# Patient Record
Sex: Male | Born: 1955 | Race: White | Hispanic: No | Marital: Married | Smoking: Current every day smoker
Health system: Southern US, Community
[De-identification: ages and names within clinical notes are randomized; demographics above are authoritative.]

## PROBLEM LIST (undated history)

## (undated) DIAGNOSIS — I10 Essential (primary) hypertension: Secondary | ICD-10-CM

## (undated) DIAGNOSIS — R430 Anosmia: Secondary | ICD-10-CM

## (undated) DIAGNOSIS — T7840XA Allergy, unspecified, initial encounter: Secondary | ICD-10-CM

## (undated) DIAGNOSIS — M549 Dorsalgia, unspecified: Secondary | ICD-10-CM

## (undated) DIAGNOSIS — C801 Malignant (primary) neoplasm, unspecified: Secondary | ICD-10-CM

## (undated) DIAGNOSIS — R000 Tachycardia, unspecified: Secondary | ICD-10-CM

## (undated) DIAGNOSIS — J449 Chronic obstructive pulmonary disease, unspecified: Secondary | ICD-10-CM

## (undated) HISTORY — PX: HERNIA REPAIR: SHX51

## (undated) HISTORY — DX: Essential (primary) hypertension: I10

## (undated) HISTORY — DX: Dorsalgia, unspecified: M54.9

## (undated) HISTORY — DX: Allergy, unspecified, initial encounter: T78.40XA

## (undated) HISTORY — PX: BACK SURGERY: SHX140

## (undated) HISTORY — PX: VASECTOMY: SHX75

## (undated) HISTORY — DX: Chronic obstructive pulmonary disease, unspecified: J44.9

## (undated) HISTORY — DX: Anosmia: R43.0

---

## 2000-09-09 ENCOUNTER — Inpatient Hospital Stay (HOSPITAL_COMMUNITY): Admission: EM | Admit: 2000-09-09 | Discharge: 2000-09-11 | Payer: Self-pay | Admitting: Emergency Medicine

## 2000-09-09 ENCOUNTER — Encounter: Payer: Self-pay | Admitting: Emergency Medicine

## 2009-07-05 ENCOUNTER — Encounter: Payer: Self-pay | Admitting: Gastroenterology

## 2009-07-23 ENCOUNTER — Ambulatory Visit (HOSPITAL_COMMUNITY): Admission: RE | Admit: 2009-07-23 | Discharge: 2009-07-23 | Payer: Self-pay | Admitting: Gastroenterology

## 2009-07-23 ENCOUNTER — Ambulatory Visit: Payer: Self-pay | Admitting: Gastroenterology

## 2010-02-05 NOTE — Letter (Signed)
Summary: Internal Other/triage  Internal Other/triage   Imported By: Cloria Spring LPN 16/10/9602 54:09:81  _____________________________________________________________________  External Attachment:    Type:   Image     Comment:   External Document

## 2012-06-04 ENCOUNTER — Other Ambulatory Visit: Payer: Self-pay | Admitting: Family Medicine

## 2012-06-07 ENCOUNTER — Encounter: Payer: Self-pay | Admitting: *Deleted

## 2012-06-08 ENCOUNTER — Ambulatory Visit (INDEPENDENT_AMBULATORY_CARE_PROVIDER_SITE_OTHER): Payer: BC Managed Care – PPO | Admitting: Family Medicine

## 2012-06-08 ENCOUNTER — Encounter: Payer: Self-pay | Admitting: Family Medicine

## 2012-06-08 VITALS — BP 144/86 | HR 80 | Wt 178.0 lb

## 2012-06-08 DIAGNOSIS — J309 Allergic rhinitis, unspecified: Secondary | ICD-10-CM

## 2012-06-08 DIAGNOSIS — I10 Essential (primary) hypertension: Secondary | ICD-10-CM | POA: Insufficient documentation

## 2012-06-08 MED ORDER — ENALAPRIL MALEATE 10 MG PO TABS: 10.0000 mg | ORAL_TABLET | Freq: Every day | ORAL | Status: DC

## 2012-06-08 NOTE — Progress Notes (Signed)
  Subjective:    Patient ID: Leonard Armstrong, male    DOB: 1955-03-02, 57 y.o.   MRN: [id number]  Hypertension This is a chronic problem. The current episode started more than 1 year ago. The problem is unchanged. The problem is controlled. There are no associated agents to hypertension. Past treatments include ACE inhibitors. The current treatment provides moderate improvement. Compliance problems include exercise.  There is no history of angina. There is no history of chronic renal disease.   Allergies not so ad this spring.   Takes b p med during day,  Review of Systems    no chest pain no abdominal pain no shortness of breath otherwise negative. Unfortunately still smoking. Objective:   Physical Exam  Alert no acute distress. HEENT slight nasal congestion. Lungs clear. Heart regular rate and rhythm. Blood pressure 132/80 on repeat      Assessment & Plan:  Impression 1 hypertension good control. #2 chronic rhinitis stable. Plan encouraged to stop smoking. Diet exercise discussed. Maintain same meds.

## 2013-03-18 ENCOUNTER — Other Ambulatory Visit: Payer: Self-pay | Admitting: *Deleted

## 2013-03-18 MED ORDER — SILDENAFIL CITRATE 50 MG PO TABS: 50.0000 mg | ORAL_TABLET | Freq: Every day | ORAL | Status: DC | PRN

## 2013-03-19 ENCOUNTER — Other Ambulatory Visit: Payer: Self-pay | Admitting: *Deleted

## 2013-03-19 ENCOUNTER — Encounter: Payer: Self-pay | Admitting: *Deleted

## 2013-03-28 ENCOUNTER — Other Ambulatory Visit: Payer: Self-pay | Admitting: *Deleted

## 2013-03-28 MED ORDER — ENALAPRIL MALEATE 10 MG PO TABS: 10.0000 mg | ORAL_TABLET | Freq: Every day | ORAL | Status: DC

## 2013-04-11 ENCOUNTER — Encounter: Payer: Self-pay | Admitting: Family Medicine

## 2013-04-11 ENCOUNTER — Ambulatory Visit (INDEPENDENT_AMBULATORY_CARE_PROVIDER_SITE_OTHER): Payer: BC Managed Care – PPO | Admitting: Family Medicine

## 2013-04-11 VITALS — BP 148/88 | Ht 71.5 in | Wt 182.2 lb

## 2013-04-11 DIAGNOSIS — Z79899 Other long term (current) drug therapy: Secondary | ICD-10-CM

## 2013-04-11 DIAGNOSIS — Z125 Encounter for screening for malignant neoplasm of prostate: Secondary | ICD-10-CM

## 2013-04-11 DIAGNOSIS — Z0000 Encounter for general adult medical examination without abnormal findings: Secondary | ICD-10-CM

## 2013-04-11 DIAGNOSIS — Z23 Encounter for immunization: Secondary | ICD-10-CM

## 2013-04-11 MED ORDER — ENALAPRIL MALEATE 10 MG PO TABS: 10.0000 mg | ORAL_TABLET | Freq: Every day | ORAL | Status: DC

## 2013-04-11 MED ORDER — SILDENAFIL CITRATE 100 MG PO TABS: 50.0000 mg | ORAL_TABLET | Freq: Every day | ORAL | Status: DC | PRN

## 2013-04-11 NOTE — Progress Notes (Signed)
   Subjective:    Patient ID: Leonard Armstrong, male    DOB: Jun 04, 1955, 58 y.o.   MRN: [id number]  HPI Patient arrives for a yearly physical.  Allergies acting up some, started flonase otcf on it Exercise mod not tolerable  Aches and pains are starting to slow pt dow No problems or concerns.  Colon not due for yrs, had colonocopy and given a ten yr pass  Patient unfortunately still smoking. Some shortness of breath with excessive exertion.  Claims compliance with blood pressure medicine. No obvious side effects. Watching salt intake.  Continues to work full-time no major difficulties with this.  Review of Systems  Constitutional: Negative for fever, activity change and appetite change.  HENT: Negative for congestion and rhinorrhea.   Eyes: Negative for discharge.  Respiratory: Negative for cough and wheezing.   Cardiovascular: Negative for chest pain.  Gastrointestinal: Negative for vomiting, abdominal pain and blood in stool.  Genitourinary: Negative for frequency and difficulty urinating.  Musculoskeletal: Negative for neck pain.  Skin: Negative for rash.  Allergic/Immunologic: Negative for environmental allergies and food allergies.  Neurological: Negative for weakness and headaches.  Psychiatric/Behavioral: Negative for agitation.  All other systems reviewed and are negative.       Objective:   Physical Exam  Vitals reviewed. Constitutional: He appears well-developed and well-nourished.  HENT:  Head: Normocephalic and atraumatic.  Right Ear: External ear normal.  Left Ear: External ear normal.  Nose: Nose normal.  Mouth/Throat: Oropharynx is clear and moist.  Eyes: EOM are normal. Pupils are equal, round, and reactive to light.  Neck: Normal range of motion. Neck supple. No thyromegaly present.  Cardiovascular: Normal rate, regular rhythm and normal heart sounds.   No murmur heard. Pulmonary/Chest: Effort normal and breath sounds normal. No respiratory distress.  He has no wheezes.  Abdominal: Soft. Bowel sounds are normal. He exhibits no distension and no mass. There is no tenderness.  Genitourinary: Penis normal.  Musculoskeletal: Normal range of motion. He exhibits no edema.  Lymphadenopathy:    He has no cervical adenopathy.  Neurological: He is alert. He exhibits normal muscle tone.  Skin: Skin is warm and dry. No erythema.  Psychiatric: He has a normal mood and affect. His behavior is normal. Judgment normal.    Prostate within normal limits      Assessment & Plan:  Impression 1 wellness exam #2 hypertension good control. #3 chronic smoker discussed #4 erectile dysfunction #5 up-to-date on colonoscopy plan Hemoccult cards. Pneumonia vaccine. Diet exercise discussed in encourage. Encouraged to stop smoking. Appropriate blood work. Further recommendations based on results. WSL

## 2013-12-25 ENCOUNTER — Emergency Department (HOSPITAL_COMMUNITY): Payer: BC Managed Care – PPO

## 2013-12-25 ENCOUNTER — Observation Stay (HOSPITAL_COMMUNITY)
Admission: EM | Admit: 2013-12-25 | Discharge: 2013-12-26 | Disposition: A | Discharge status: Home / Self Care | Payer: BC Managed Care – PPO | Attending: Internal Medicine | Admitting: Internal Medicine

## 2013-12-25 ENCOUNTER — Encounter (HOSPITAL_COMMUNITY): Payer: Self-pay | Admitting: Emergency Medicine

## 2013-12-25 DIAGNOSIS — F17200 Nicotine dependence, unspecified, uncomplicated: Secondary | ICD-10-CM | POA: Diagnosis present

## 2013-12-25 DIAGNOSIS — I10 Essential (primary) hypertension: Secondary | ICD-10-CM | POA: Diagnosis present

## 2013-12-25 DIAGNOSIS — Z7951 Long term (current) use of inhaled steroids: Secondary | ICD-10-CM | POA: Diagnosis not present

## 2013-12-25 DIAGNOSIS — F1010 Alcohol abuse, uncomplicated: Secondary | ICD-10-CM | POA: Diagnosis present

## 2013-12-25 DIAGNOSIS — R000 Tachycardia, unspecified: Secondary | ICD-10-CM | POA: Diagnosis present

## 2013-12-25 DIAGNOSIS — F17210 Nicotine dependence, cigarettes, uncomplicated: Secondary | ICD-10-CM

## 2013-12-25 DIAGNOSIS — Z79899 Other long term (current) drug therapy: Secondary | ICD-10-CM | POA: Diagnosis not present

## 2013-12-25 DIAGNOSIS — Z720 Tobacco use: Secondary | ICD-10-CM | POA: Diagnosis not present

## 2013-12-25 DIAGNOSIS — I471 Supraventricular tachycardia: Secondary | ICD-10-CM | POA: Diagnosis not present

## 2013-12-25 HISTORY — DX: Tachycardia, unspecified: R00.0

## 2013-12-25 LAB — URINALYSIS, ROUTINE W REFLEX MICROSCOPIC
BILIRUBIN URINE: NEGATIVE
GLUCOSE, UA: NEGATIVE mg/dL
HGB URINE DIPSTICK: NEGATIVE
KETONES UR: NEGATIVE mg/dL
Leukocytes, UA: NEGATIVE
Nitrite: NEGATIVE
PH: 6 (ref 5.0–8.0)
Protein, ur: NEGATIVE mg/dL
Specific Gravity, Urine: 1.01 (ref 1.005–1.030)
Urobilinogen, UA: 0.2 mg/dL (ref 0.0–1.0)

## 2013-12-25 LAB — CBC WITH DIFFERENTIAL/PLATELET
Basophils Absolute: 0 10*3/uL (ref 0.0–0.1)
Basophils Relative: 0 % (ref 0–1)
EOS ABS: 0.1 10*3/uL (ref 0.0–0.7)
Eosinophils Relative: 1 % (ref 0–5)
HCT: 43.8 % (ref 39.0–52.0)
HEMOGLOBIN: 14.9 g/dL (ref 13.0–17.0)
LYMPHS ABS: 2.4 10*3/uL (ref 0.7–4.0)
Lymphocytes Relative: 26 % (ref 12–46)
MCH: 33.9 pg (ref 26.0–34.0)
MCHC: 34 g/dL (ref 30.0–36.0)
MCV: 99.8 fL (ref 78.0–100.0)
MONO ABS: 0.7 10*3/uL (ref 0.1–1.0)
MONOS PCT: 8 % (ref 3–12)
Neutro Abs: 5.8 10*3/uL (ref 1.7–7.7)
Neutrophils Relative %: 65 % (ref 43–77)
Platelets: 208 10*3/uL (ref 150–400)
RBC: 4.39 MIL/uL (ref 4.22–5.81)
RDW: 13.2 % (ref 11.5–15.5)
WBC: 8.9 10*3/uL (ref 4.0–10.5)

## 2013-12-25 LAB — BASIC METABOLIC PANEL
Anion gap: 13 (ref 5–15)
BUN: 15 mg/dL (ref 6–23)
CALCIUM: 9.2 mg/dL (ref 8.4–10.5)
CHLORIDE: 100 meq/L (ref 96–112)
CO2: 24 mEq/L (ref 19–32)
CREATININE: 0.83 mg/dL (ref 0.50–1.35)
GFR calc Af Amer: >90 mL/min (ref >90)
GFR calc non Af Amer: >90 mL/min (ref >90)
Glucose, Bld: 95 mg/dL (ref 70–99)
Potassium: 3.8 mEq/L (ref 3.7–5.3)
Sodium: 137 mEq/L (ref 137–147)

## 2013-12-25 LAB — TROPONIN I
Troponin I: <0.3 ng/mL (ref <0.30)
Troponin I: <0.3 ng/mL (ref <0.30)

## 2013-12-25 LAB — MAGNESIUM: Magnesium: 2.1 mg/dL (ref 1.5–2.5)

## 2013-12-25 MED ORDER — SODIUM CHLORIDE 0.9 % IJ SOLN
3.0000 mL | Freq: Two times a day (BID) | INTRAMUSCULAR | Status: DC
  Administered 2013-12-25 – 2013-12-26 (×2): 3 mL via INTRAVENOUS

## 2013-12-25 MED ORDER — METOPROLOL TARTRATE 1 MG/ML IV SOLN
5.0000 mg | Freq: Once | INTRAVENOUS | Status: AC
  Administered 2013-12-25: 5 mg via INTRAVENOUS
  Filled 2013-12-25: qty 5

## 2013-12-25 MED ORDER — ACETAMINOPHEN 325 MG PO TABS: 650.0000 mg | ORAL_TABLET | Freq: Four times a day (QID) | ORAL | Status: DC | PRN

## 2013-12-25 MED ORDER — LABETALOL HCL 5 MG/ML IV SOLN: 5.0000 mg | Freq: Four times a day (QID) | INTRAVENOUS | Status: DC | PRN

## 2013-12-25 MED ORDER — ACETAMINOPHEN 650 MG RE SUPP: 650.0000 mg | Freq: Four times a day (QID) | RECTAL | Status: DC | PRN

## 2013-12-25 MED ORDER — POLYETHYLENE GLYCOL 3350 17 G PO PACK
17.0000 g | PACK | Freq: Every day | ORAL | Status: DC
  Administered 2013-12-25 – 2013-12-26 (×2): 17 g via ORAL
  Filled 2013-12-25 (×2): qty 1

## 2013-12-25 MED ORDER — HEPARIN SODIUM (PORCINE) 5000 UNIT/ML IJ SOLN
5000.0000 [IU] | Freq: Three times a day (TID) | INTRAMUSCULAR | Status: DC
  Administered 2013-12-25 – 2013-12-26 (×2): 5000 [IU] via SUBCUTANEOUS
  Filled 2013-12-25 (×3): qty 1

## 2013-12-25 MED ORDER — SODIUM CHLORIDE 0.9 % IJ SOLN: 3.0000 mL | INTRAMUSCULAR | Status: DC | PRN

## 2013-12-25 MED ORDER — SODIUM CHLORIDE 0.9 % IJ SOLN
3.0000 mL | Freq: Two times a day (BID) | INTRAMUSCULAR | Status: DC
  Administered 2013-12-25: 3 mL via INTRAVENOUS

## 2013-12-25 MED ORDER — ADENOSINE 6 MG/2ML IV SOLN
INTRAVENOUS | Status: AC
Start: 2013-12-25 — End: 2013-12-26
  Filled 2013-12-25: qty 10

## 2013-12-25 MED ORDER — SODIUM CHLORIDE 0.9 % IV SOLN: 250.0000 mL | INTRAVENOUS | Status: DC | PRN

## 2013-12-25 MED ORDER — NICOTINE 21 MG/24HR TD PT24
21.0000 mg | MEDICATED_PATCH | Freq: Every day | TRANSDERMAL | Status: DC
  Administered 2013-12-26: 21 mg via TRANSDERMAL
  Filled 2013-12-25: qty 1

## 2013-12-25 NOTE — ED Notes (Addendum)
metoprolol given per order, holding adenoisine for now.  Second IV placed, urine specimen obtained.

## 2013-12-25 NOTE — ED Provider Notes (Signed)
CSN: [id number]     Arrival date & time 12/25/13  1956 History   First MD Initiated Contact with Patient 12/25/13 1959     Chief Complaint  Patient presents with  . Tachycardia      HPI Pt was seen at 2000. Per EMS and pt report, c/o sudden onset and persistence of multiple episodes of palpitations that began earlier today. Pt states he has hx of same and can usually improve with "coughing" and "bearing down." Pt states the palpitations returned despite performing these maneuvers. Pt endorses hx of "SVT" and was previously on "a beta blocker" but "got better so they took me off of it." EMS states pt was in NSR, rate 70-80's on their arrival to scene, but then had several episodes of SVT to 180's. Pt was able to decrease his HR en route by performing vagal maneuvers. On arrival to the ED, pt's HR again 180's. Pt denies any other complaints. Denies CP/SOB, no abd pain, no back pain, no N/V/D, no syncope.     Past Medical History  Diagnosis Date  . Back pain   . Hypertension   . Loss of smell   . Allergy   . Tachycardia    Past Surgical History  Procedure Laterality Date  . Back surgery    . Vasectomy    . Hernia repair     Family History  Problem Relation Age of Onset  . Lung cancer Mother   . Cancer Mother     lung  . Hypertension Father   . Cancer Father     father  . Heart disease Other   . Cancer Other     colon   History  Substance Use Topics  . Smoking status: Current Every Day Smoker  . Smokeless tobacco: Not on file  . Alcohol Use: No    Review of Systems ROS: Statement: All systems negative except as marked or noted in the HPI; Constitutional: Negative for fever and chills. ; ; Eyes: Negative for eye pain, redness and discharge. ; ; ENMT: Negative for ear pain, hoarseness, nasal congestion, sinus pressure and sore throat. ; ; Cardiovascular: +palpitations. Negative for chest pain, diaphoresis, dyspnea and peripheral edema. ; ; Respiratory: Negative for cough,  wheezing and stridor. ; ; Gastrointestinal: Negative for nausea, vomiting, diarrhea, abdominal pain, blood in stool, hematemesis, jaundice and rectal bleeding. . ; ; Genitourinary: Negative for dysuria, flank pain and hematuria. ; ; Musculoskeletal: Negative for back pain and neck pain. Negative for swelling and trauma.; ; Skin: Negative for pruritus, rash, abrasions, blisters, bruising and skin lesion.; ; Neuro: Negative for headache, lightheadedness and neck stiffness. Negative for weakness, altered level of consciousness , altered mental status, extremity weakness, paresthesias, involuntary movement, seizure and syncope.      Allergies  Other; Prednisone; and Wasp venom  Home Medications   Prior to Admission medications   Medication Sig Start Date End Date Taking? Authorizing Provider  enalapril (VASOTEC) 10 MG tablet Take 1 tablet (10 mg total) by mouth daily. 04/11/13   Mikey Kirschner, MD  fluticasone (FLONASE) 50 MCG/ACT nasal spray Place 2 sprays into the nose daily.    Historical Provider, MD  sildenafil (VIAGRA) 100 MG tablet Take 0.5-1 tablets (50-100 mg total) by mouth daily as needed for erectile dysfunction. 04/11/13   Mikey Kirschner, MD   BP 112/76 mmHg  Pulse 87  Temp(Src) 98.3 F (36.8 C) (Oral)  Resp 19  Ht 5\' 11"  (1.803 m)  Wt 175  lb (79.379 kg)  BMI 24.42 kg/m2  SpO2 98% Physical Exam  2005: Physical examination:  Nursing notes reviewed; Vital signs and O2 SAT reviewed;  Constitutional: Well developed, Well nourished, Well hydrated, In no acute distress; Head:  Normocephalic, atraumatic; Eyes: EOMI, PERRL, No scleral icterus; ENMT: Mouth and pharynx normal, Mucous membranes moist; Neck: Supple, Full range of motion, No lymphadenopathy; Cardiovascular: Tachycardic rate and regular rhythm, No gallop; Respiratory: Breath sounds clear & equal bilaterally, No wheezes.  Speaking full sentences with ease, Normal respiratory effort/excursion; Chest: Nontender, Movement normal;  Abdomen: Soft, Nontender, Nondistended, Normal bowel sounds; Genitourinary: No CVA tenderness; Extremities: Pulses normal, No tenderness, No edema, No calf edema or asymmetry.; Neuro: AA&Ox3, Major CN grossly intact.  Speech clear. No gross focal motor or sensory deficits in extremities.; Skin: Color normal, Warm, Dry.   ED Course  Procedures     EKG Interpretation   Date/Time:  Sunday December 25 2013 19:58:00 EST  On arrival Ventricular Rate:  177 PR Interval:    QRS Duration: 77 QT Interval:  274 QTC Calculation: 470 R Axis:   -18 Text Interpretation:  Supraventricular tachycardia Borderline left axis  deviation ST depression, probably rate related No old tracing to compare  Confirmed by [facility]  MD, Donika Butner [phone]) on 12/25/2013 8:59:27 PM      EKG Interpretation  Date/Time:  Sunday December 25 2013 21:00:59 EST  Repeat Ventricular Rate:  85 PR Interval:  220 QRS Duration: 83 QT Interval:  389 QTC Calculation: 463 R Axis:   -18 Text Interpretation:  Sinus rhythm Sinus pause Prolonged PR interval Inferior infarct, age indeterminate Probable anteroseptal infarct, old Normal sinus rhythm has replaced Supraventricular tachycardia Since last tracing of earlier today Confirmed by [facility]  MD, Nunzio Cory [phone]) on 12/25/2013 9:10:07 PM         MDM  MDM Reviewed: previous chart, nursing note and vitals Reviewed previous: labs Interpretation: ECG, labs and x-ray Total time providing critical care: 30-74 minutes. This excludes time spent performing separately reportable procedures and services. Consults: admitting MD    CRITICAL CARE Performed by: Alfonzo Feller Total critical care time: 35 Critical care time was exclusive of separately billable procedures and treating other patients. Critical care was necessary to treat or prevent imminent or life-threatening deterioration. Critical care was time spent personally by me on the following activities: development of  treatment plan with patient and/or surrogate as well as nursing, discussions with consultants, evaluation of patient's response to treatment, examination of patient, obtaining history from patient or surrogate, ordering and performing treatments and interventions, ordering and review of laboratory studies, ordering and review of radiographic studies, pulse oximetry and re-evaluation of patient's condition.    Results for orders placed or performed during the hospital encounter of 34/19/62  Basic metabolic panel  Result Value Ref Range   Sodium 137 137 - 147 mEq/L   Potassium 3.8 3.7 - 5.3 mEq/L   Chloride 100 96 - 112 mEq/L   CO2 24 19 - 32 mEq/L   Glucose, Bld 95 70 - 99 mg/dL   BUN 15 6 - 23 mg/dL   Creatinine, Ser 0.83 0.50 - 1.35 mg/dL   Calcium 9.2 8.4 - 10.5 mg/dL   GFR calc non Af Amer >90 >90 mL/min   GFR calc Af Amer >90 >90 mL/min   Anion gap 13 5 - 15  Troponin I  Result Value Ref Range   Troponin I <0.30 <0.30 ng/mL  CBC with Differential  Result Value Ref Range  WBC 8.9 4.0 - 10.5 K/uL   RBC 4.39 4.22 - 5.81 MIL/uL   Hemoglobin 14.9 13.0 - 17.0 g/dL   HCT 43.8 39.0 - 52.0 %   MCV 99.8 78.0 - 100.0 fL   MCH 33.9 26.0 - 34.0 pg   MCHC 34.0 30.0 - 36.0 g/dL   RDW 13.2 11.5 - 15.5 %   Platelets 208 150 - 400 K/uL   Neutrophils Relative % 65 43 - 77 %   Neutro Abs 5.8 1.7 - 7.7 K/uL   Lymphocytes Relative 26 12 - 46 %   Lymphs Abs 2.4 0.7 - 4.0 K/uL   Monocytes Relative 8 3 - 12 %   Monocytes Absolute 0.7 0.1 - 1.0 K/uL   Eosinophils Relative 1 0 - 5 %   Eosinophils Absolute 0.1 0.0 - 0.7 K/uL   Basophils Relative 0 0 - 1 %   Basophils Absolute 0.0 0.0 - 0.1 K/uL  Magnesium  Result Value Ref Range   Magnesium 2.1 1.5 - 2.5 mg/dL  Urinalysis, Routine w reflex microscopic  Result Value Ref Range   Color, Urine YELLOW YELLOW   APPearance CLEAR CLEAR   Specific Gravity, Urine 1.010 1.005 - 1.030   pH 6.0 5.0 - 8.0   Glucose, UA NEGATIVE NEGATIVE mg/dL   Hgb  urine dipstick NEGATIVE NEGATIVE   Bilirubin Urine NEGATIVE NEGATIVE   Ketones, ur NEGATIVE NEGATIVE mg/dL   Protein, ur NEGATIVE NEGATIVE mg/dL   Urobilinogen, UA 0.2 0.0 - 1.0 mg/dL   Nitrite NEGATIVE NEGATIVE   Leukocytes, UA NEGATIVE NEGATIVE   Dg Chest Port 1 View 12/25/2013   CLINICAL DATA:  Supraventricular tachycardia with response to coughing. History of hypertension and smoking. Initial encounter.  EXAM: PORTABLE CHEST - 1 VIEW  COMPARISON:  None.  FINDINGS: The heart size and mediastinal contours are normal. The lungs are clear. There is no pleural effusion or pneumothorax. No acute osseous findings are identified.  IMPRESSION: No active cardiopulmonary process.   Electronically Signed   By: Camie Patience M.D.   On: 12/25/2013 20:22    2105:  On arrival to the ED, pt's HR 180's, monitor with narrow complex, regular tachycardia (SVT). Pt would spontaneously drop his rate to 80's/NSR, then shortly afterwards increase again to SVT 170-180's. Prepared several times to given IV adenosine, but pt spontaneously converted on his own before dose was given. IV metoprolol given with good effect. Pt's monitor now with NSR, rate 80's, with intermittent sinus pauses. No afib, no further SVT. BP stable. Continues to deny CP/SOB. Dx and testing d/w pt and family.  Questions answered.  Verb understanding, agreeable to observation admit. T/C to [facility]. Wendee Beavers, case discussed, including:  HPI, pertinent PM/SHx, VS/PE, dx testing, ED course and treatment:  Agreeable to admit, requests to write temporary orders, obtain observation tele bed to team APAdmits.    Francine Graven, DO 12/26/13 1530

## 2013-12-25 NOTE — ED Notes (Signed)
Heart rate down to 70's SR with some irregularity, P wave and QRS complex present.

## 2013-12-25 NOTE — H&P (Signed)
[facility] History and Physical  MICKIE KOZIKOWSKI ZOX:[id number] DOB: 1955-03-25 DOA: 12/25/2013  Referring physician: Dr. Thurnell Garbe PCP: Rubbie Battiest, MD   Chief Complaint: "Felt my heart racing"  HPI: Leonard Armstrong is a 58 y.o. male  With history of ongoing tobacco use, history of tachycardia, and hypertension. The patient reports that he was just not feeling too well today. He felt like he had some pressure and felt as if he had some gas. Was unable to relieve himself. When he got up to use the restroom he noticed his heart started to race. He states he has felt heart palpitations on and off for years. 20 years ago was given a beta blocker for heart fast heart rate for 6 months. He has not seen a cardiologist in 15 years. Reportedly when EMS arrived they noticed abnormal EKG and family reports that patient had SVT which resolved after coughing. The patient denies any sick contacts. Denies any hematemesis or productive cough.  We were consulted for further medical evaluation and recommendations.   Review of Systems:  Constitutional:  No weight loss, night sweats, Fevers, chills, fatigue.  HEENT:  No headaches, Difficulty swallowing,Tooth/dental problems,Sore throat,  No sneezing, itching, ear ache, nasal congestion, post nasal drip,  Cardio-vascular:  No chest pain, Orthopnea, PND, swelling in lower extremities, anasarca, dizziness, + palpitations (not currently) GI:  No heartburn, indigestion, abdominal pain, nausea, vomiting, diarrhea, change in bowel habits, loss of appetite  Resp:  No shortness of breath with exertion or at rest. No excess mucus, no productive cough, No non-productive cough, No coughing up of blood.No change in color of mucus.No wheezing.No chest wall deformity  Skin:  no rash or lesions.  GU:  no dysuria, change in color of urine, no urgency or frequency. No flank pain.  Musculoskeletal:  No joint pain or swelling. No decreased range of motion. No back  pain.  Psych:  No change in mood or affect. No depression or anxiety. No memory loss.   Past Medical History  Diagnosis Date  . Back pain   . Hypertension   . Loss of smell   . Allergy   . Tachycardia    Past Surgical History  Procedure Laterality Date  . Back surgery    . Vasectomy    . Hernia repair     Social History:  reports that he has been smoking.  He does not have any smokeless tobacco history on file. He reports that he does not drink alcohol. His drug history is not on file.  Allergies  Allergen Reactions  . Other Other (See Comments)    Steroids- not in right state of mind. Makes vital signs erratic.   Marland Kitchen Prednisone Other (See Comments)    Not in right state of mind, makes vital signs erratic.   Marland Kitchen Wasp Venom Swelling    Family History  Problem Relation Age of Onset  . Lung cancer Mother   . Cancer Mother     lung  . Hypertension Father   . Cancer Father     father  . Heart disease Other   . Cancer Other     colon     Prior to Admission medications   Medication Sig Start Date End Date Taking? Authorizing Provider  enalapril (VASOTEC) 10 MG tablet Take 1 tablet (10 mg total) by mouth daily. 04/11/13   Mikey Kirschner, MD  fluticasone (FLONASE) 50 MCG/ACT nasal spray Place 2 sprays into the nose daily.    Historical  Provider, MD  sildenafil (VIAGRA) 100 MG tablet Take 0.5-1 tablets (50-100 mg total) by mouth daily as needed for erectile dysfunction. 04/11/13   Mikey Kirschner, MD   Physical Exam: Filed Vitals:   12/25/13 2000 12/25/13 2007 12/25/13 2015 12/25/13 2102  BP: 111/83 111/83  112/76  Pulse: 101 98 98 87  Temp:  98.3 F (36.8 C)    TempSrc:  Oral    Resp: 13 20  19   Height:  5\' 11"  (1.803 m)    Weight:  79.379 kg (175 lb)    SpO2: 98% 100%  98%    Wt Readings from Last 3 Encounters:  12/25/13 79.379 kg (175 lb)  04/11/13 82.645 kg (182 lb 3.2 oz)  06/08/12 80.74 kg (178 lb)    General:  Appears calm and comfortable Eyes: PERRL,  normal lids, irises & conjunctiva ENT: grossly normal hearing, lips & tongue Neck: no LAD, masses or thyromegaly Cardiovascular: RRR, no m/r/g. No LE edema. Telemetry: SR, no arrhythmias  Respiratory: CTA bilaterally, no w/r/r. Normal respiratory effort. Abdomen: soft, nt, nd Skin: no rash or induration seen on limited exam Musculoskeletal: grossly normal tone BUE/BLE Psychiatric: grossly normal mood and affect, speech fluent and appropriate Neurologic: Answers questions appropriately no facial asymmetry           Labs on Admission:  Basic Metabolic Panel:  Recent Labs Lab 12/25/13 2010  NA 137  K 3.8  CL 100  CO2 24  GLUCOSE 95  BUN 15  CREATININE 0.83  CALCIUM 9.2  MG 2.1   Liver Function Tests: No results for input(s): AST, ALT, ALKPHOS, BILITOT, PROT, ALBUMIN in the last 168 hours. No results for input(s): LIPASE, AMYLASE in the last 168 hours. No results for input(s): AMMONIA in the last 168 hours. CBC:  Recent Labs Lab 12/25/13 2010  WBC 8.9  NEUTROABS 5.8  HGB 14.9  HCT 43.8  MCV 99.8  PLT 208   Cardiac Enzymes:  Recent Labs Lab 12/25/13 2010  TROPONINI <0.30    BNP (last 3 results) No results for input(s): PROBNP in the last 8760 hours. CBG: No results for input(s): GLUCAP in the last 168 hours.  Radiological Exams on Admission: Dg Chest Port 1 View  12/25/2013   CLINICAL DATA:  Supraventricular tachycardia with response to coughing. History of hypertension and smoking. Initial encounter.  EXAM: PORTABLE CHEST - 1 VIEW  COMPARISON:  None.  FINDINGS: The heart size and mediastinal contours are normal. The lungs are clear. There is no pleural effusion or pneumothorax. No acute osseous findings are identified.  IMPRESSION: No active cardiopulmonary process.   Electronically Signed   By: Camie Patience M.D.   On: 12/25/2013 20:22    EKG: Independently reviewed. Sinus rhythm with no ST elevations or depressions  Assessment/Plan Principle  problem:  Tachycardia - Resolved. Reportedly patient was given B blocker in ED. Has had sinus pauses  - Will monitor on telemetry - Consult cardiology to see if patient warrants further work up from their standpoint. NPO after midnight. - Obtain echocardiogram  Active Problems:   HTN (hypertension) - B blocker PRN on board.    Nicotine dependence - Recommended cessation - nicotine patch   Code Status:  DVT Prophylaxis: Family Communication: Disposition Plan:  Time spent: > 45 minutes  Velvet Bathe [facility] Pager [phone]

## 2013-12-25 NOTE — ED Notes (Signed)
Family at bedside. Repeat EKG done. Patient states that he is not having any pain at this time. Vitals are within normal limits.

## 2013-12-25 NOTE — ED Notes (Signed)
Pt has had episodes of SVT. Up to 174. When patient coughs and vagals down rate goes down for several seconds but then rate goes back up

## 2013-12-25 NOTE — ED Notes (Addendum)
EKG done and given to EDP. Patient in hospital gown and placed on cardiac monitor.

## 2013-12-26 ENCOUNTER — Encounter (HOSPITAL_COMMUNITY): Payer: Self-pay | Admitting: Adult Health

## 2013-12-26 DIAGNOSIS — F1010 Alcohol abuse, uncomplicated: Secondary | ICD-10-CM | POA: Diagnosis present

## 2013-12-26 DIAGNOSIS — I471 Supraventricular tachycardia: Secondary | ICD-10-CM

## 2013-12-26 LAB — TROPONIN I: Troponin I: <0.3 ng/mL (ref <0.30)

## 2013-12-26 LAB — CBC
HCT: 43.2 % (ref 39.0–52.0)
Hemoglobin: 14.5 g/dL (ref 13.0–17.0)
MCH: 33.7 pg (ref 26.0–34.0)
MCHC: 33.6 g/dL (ref 30.0–36.0)
MCV: 100.5 fL — AB (ref 78.0–100.0)
Platelets: 182 10*3/uL (ref 150–400)
RBC: 4.3 MIL/uL (ref 4.22–5.81)
RDW: 13 % (ref 11.5–15.5)
WBC: 5.4 10*3/uL (ref 4.0–10.5)

## 2013-12-26 LAB — PHOSPHORUS: PHOSPHORUS: 3.8 mg/dL (ref 2.3–4.6)

## 2013-12-26 LAB — BASIC METABOLIC PANEL
Anion gap: 13 (ref 5–15)
BUN: 11 mg/dL (ref 6–23)
CALCIUM: 8.6 mg/dL (ref 8.4–10.5)
CO2: 22 mEq/L (ref 19–32)
Chloride: 105 mEq/L (ref 96–112)
Creatinine, Ser: 0.72 mg/dL (ref 0.50–1.35)
GFR calc Af Amer: >90 mL/min (ref >90)
GFR calc non Af Amer: >90 mL/min (ref >90)
GLUCOSE: 92 mg/dL (ref 70–99)
Potassium: 4 mEq/L (ref 3.7–5.3)
Sodium: 140 mEq/L (ref 137–147)

## 2013-12-26 LAB — MAGNESIUM: MAGNESIUM: 2.3 mg/dL (ref 1.5–2.5)

## 2013-12-26 MED ORDER — METOPROLOL TARTRATE 12.5 MG HALF TABLET: 12.5000 mg | ORAL_TABLET | Freq: Two times a day (BID) | ORAL | Status: DC

## 2013-12-26 MED ORDER — METOPROLOL TARTRATE 25 MG PO TABS
12.5000 mg | ORAL_TABLET | Freq: Two times a day (BID) | ORAL | Status: DC
  Administered 2013-12-26: 12.5 mg via ORAL
  Filled 2013-12-26: qty 1

## 2013-12-26 NOTE — Progress Notes (Signed)
UR completed 

## 2013-12-26 NOTE — Progress Notes (Signed)
Patient ID: COLEN ELTZROTH, male   DOB: 1955-09-20, 58 y.o.   MRN: [id number]   Echo without significant abnormalities. TSH still pending, however can be followed up as outpatient. No further inpatient cardiac testing indicated, will have patient follow up with NP Lawerence in 3-4 weeks to f/u symptoms. Continue lopressor at discharge, would not resume enalapril at this time, follow bp on lopressor.     Zandra Abts MD

## 2013-12-26 NOTE — Progress Notes (Signed)
D.c instructions reviewed with patient and wife.  Verbalized understanding.  Pt dc'd to home with wife. Schonewitz, Eulis Canner 12/26/2013

## 2013-12-26 NOTE — Discharge Instructions (Signed)
Nicotine Addiction Nicotine can act as both a stimulant (excites/activates) and a sedative (calms/quiets). Immediately after exposure to nicotine, there is a "kick" caused in part by the drug's stimulation of the adrenal glands and resulting discharge of adrenaline (epinephrine). The rush of adrenaline stimulates the body and causes a sudden release of sugar. This means that smokers are always slightly hyperglycemic. Hyperglycemic means that the blood sugar is high, just like in diabetics. Nicotine also decreases the amount of insulin which helps control sugar levels in the body. There is an increase in blood pressure, breathing, and the rate of heart beats.  In addition, nicotine indirectly causes a release of dopamine in the brain that controls pleasure and motivation. A similar reaction is seen with other drugs of abuse, such as cocaine and heroin. This dopamine release is thought to cause the pleasurable sensations when smoking. In some different cases, nicotine can also create a calming effect, depending on sensitivity of the smoker's nervous system and the dose of nicotine taken. WHAT HAPPENS WHEN NICOTINE IS TAKEN FOR LONG PERIODS OF TIME?  Long-term use of nicotine results in addiction. It is difficult to stop.  Repeated use of nicotine creates tolerance. Higher doses of nicotine are needed to get the "kick." When nicotine use is stopped, withdrawal may last a month or more. Withdrawal may begin within a few hours after the last cigarette. Symptoms peak within the first few days and may lessen within a few weeks. For some people, however, symptoms may last for months or longer. Withdrawal symptoms include:   Irritability.  Craving.  Learning and attention deficits.  Sleep disturbances.  Increased appetite. Craving for tobacco may last for 6 months or longer. Many behaviors done while using nicotine can also play a part in the severity of withdrawal symptoms. For some people, the feel,  smell, and sight of a cigarette and the ritual of obtaining, handling, lighting, and smoking the cigarette are closely linked with the pleasure of smoking. When stopped, they also miss the related behaviors which make the withdrawal or craving worse. While nicotine gum and patches may lessen the drug aspects of withdrawal, cravings often persist. WHAT ARE THE MEDICAL CONSEQUENCES OF NICOTINE USE?  Nicotine addiction accounts for one-third of all cancers. The top cancer caused by tobacco is lung cancer. Lung cancer is the number one cancer killer of both men and women.  Smoking is also associated with cancers of the:  Mouth.  Pharynx.  Larynx.  Esophagus.  Stomach.  Pancreas.  Cervix.  Kidney.  Ureter.  Bladder.  Smoking also causes lung diseases such as lasting (chronic) bronchitis and emphysema.  It worsens asthma in adults and children.  Smoking increases the risk of heart disease, including:  Stroke.  Heart attack.  Vascular disease.  Aneurysm.  Passive or secondary smoke can also increase medical risks including:  Asthma in children.  Sudden Infant Death Syndrome (SIDS).  Additionally, dropped cigarettes are the leading cause of residential fire fatalities.  Nicotine poisoning has been reported from accidental ingestion of tobacco products by children and pets. Death usually results in a few minutes from respiratory failure (when a person stops breathing) caused by paralysis. TREATMENT   Medication. Nicotine replacement medicines such as nicotine gum and the patch are used to stop smoking. These medicines gradually lower the dosage of nicotine in the body. These medicines do not contain the carbon monoxide and other toxins found in tobacco smoke.  Hypnotherapy.  Relaxation therapy.  Nicotine Anonymous (a 12-step support   program). Find times and locations in your local yellow pages. Document Released: 08/29/2003 Document Revised: 03/17/2011 Document  Reviewed: 02/18/2013 Exit[employer] Patient Information 2015 [employer]. This information is not intended to replace advice given to you by your health care provider. Make sure you discuss any questions you have with your health care provider.  

## 2013-12-26 NOTE — Care Management Note (Signed)
    Page 1 of 1   12/26/2013     2:48:39 PM CARE MANAGEMENT NOTE 12/26/2013  Patient:  Leonard Armstrong, Leonard Armstrong   Account Number:  [id number]  Date Initiated:  12/26/2013  Documentation initiated by:  Theophilus Kinds  Subjective/Objective Assessment:   Pt admitted from home with CP. Pt lives with his wife and will return home at discharge. Pt is independent with ADL's.     Action/Plan:   No Cm needs noted. Anticipate discharge today.   Anticipated DC Date:  12/26/2013   Anticipated DC Plan:  [place]  CM consult      Choice offered to / List presented to:             Status of service:  Completed, signed off Medicare Important Message given?   (If response is "NO", the following Medicare IM given date fields will be blank) Date Medicare IM given:   Medicare IM given by:   Date Additional Medicare IM given:   Additional Medicare IM given by:    Discharge Disposition:  HOME/SELF CARE  Per UR Regulation:    If discussed at Long Length of Stay Meetings, dates discussed:    Comments:  12/26/13 Brice [place], RN BSN CM

## 2013-12-26 NOTE — Consult Note (Signed)
CARDIOLOGY CONSULT NOTE   Patient ID: Leonard Armstrong MRN: [id number] DOB/AGE: 56-Aug-1957 56 y.o.  Admit Date: 12/25/2013 Referring Physician: PTH  Primary Physician: Rubbie Battiest, MD Consulting Cardiologist: Carlyle Dolly MD Primary Cardiologist: New Reason for Consultation: Tachycardia/SVT   Clinical Summary Leonard Armstrong is a 57 y.o.male with known history of hypertension, tachycardia, ongoing tobacco abuse, who has not been seen by cardiologist in 15 years. He has been having episodes of rapid heart rate "for years' and had been on a BB for about 6 months, but was not continued on this. He states this occurs about once every 2-3 months and is not precipitated by anything. He usually drinks some water, gets up and walks around, or smoke a cigarette and it goes away.   Yesterday he states that he wasn't feeling well all day. Stomach bothered him. Had a big meal with family. It was very warm in his house. He had sudden episode of "usual" rapid HR. Did not go away with his normal response to it. He has family members, one who is a Marine scientist, and one who is EMT, who checked his pulse and found it to be rapid. EMS was called and it was found he was having bursts of SVT, return to NSR, and recurrent SVT. He was brought to ER. In route he was asked to cough and HR normalized, but again returned to tachycardic rate.  On arrival to ER, HR 180 bpm, BP 111/83. Potassium 3.8. CXR revealed nothing acute. EKG demonstrated SVT with rate of 180 bpm with possible AVRT on close look at P-waves, antereo/lateral T-wave depression, likely rate related. Given IV lopressor 5 mg with return to NSR with normalization of T-waves.  Troponin is negative X 3. Echo has been ordered.    Allergies  Allergen Reactions  . Other Other (See Comments)    Steroids- not in right state of mind. Makes vital signs erratic.   Marland Kitchen Prednisone Other (See Comments)    Not in right state of mind, makes vital signs erratic.   Marland Kitchen Wasp Venom  Swelling    Medications Scheduled Medications: . heparin  5,000 Units Subcutaneous 3 times per day  . nicotine  21 mg Transdermal Daily  . polyethylene glycol  17 g Oral Daily  . sodium chloride  3 mL Intravenous Q12H  . sodium chloride  3 mL Intravenous Q12H    Infusions:    PRN Medications: sodium chloride, acetaminophen **OR** acetaminophen, labetalol, sodium chloride   Past Medical History  Diagnosis Date  . Back pain   . Hypertension   . Loss of smell   . Allergy   . Tachycardia     Past Surgical History  Procedure Laterality Date  . Back surgery    . Vasectomy    . Hernia repair      Family History  Problem Relation Age of Onset  . Lung cancer Mother   . Cancer Mother     lung  . Hypertension Father   . Cancer Father     father  . Heart disease Other   . Cancer Other     colon    Social History Leonard Armstrong reports that he has been smoking.  He does not have any smokeless tobacco history on file. Leonard Armstrong reports that he drinks about 25.2 oz of alcohol per week.  Review of Systems Complete review of systems are found to be negative unless outlined in H&P above.  Physical Examination Blood pressure 115/71, pulse  63, temperature 98.4 F (36.9 C), temperature source Oral, resp. rate 18, height 5\' 10"  (1.778 m), weight 178 lb (80.74 kg), SpO2 96 %. No intake or output data in the 24 hours ending 12/26/13 0838  Telemetry: NSR with PACs.   GEN: No acute distress  HEENT: Conjunctiva and lids normal, oropharynx clear with moist mucosa. Neck: Supple, no elevated JVP or carotid bruits, no thyromegaly. Lungs: Clear to auscultation, nonlabored breathing at rest. Cardiac: Regular rate and rhythm, no S3 or significant systolic murmur, no pericardial rub. Abdomen: Soft, nontender, no hepatomegaly, bowel sounds present, no guarding or rebound. Extremities: No pitting edema, distal pulses 2+. Skin: Warm and dry. Musculoskeletal: No kyphosis. Neuropsychiatric:  Alert and oriented x3, affect grossly appropriate.  Prior Cardiac Testing/Procedures None  Lab Results  Basic Metabolic Panel:  Recent Labs Lab 12/25/13 0350 12/25/13 2010 12/26/13 0357  NA  --  137 140  K  --  3.8 4.0  CL  --  100 105  CO2  --  24 22  GLUCOSE  --  95 92  BUN  --  15 11  CREATININE  --  0.83 0.72  CALCIUM  --  9.2 8.6  MG 2.3 2.1  --   PHOS 3.8  --   --     CBC:  Recent Labs Lab 12/25/13 2010 12/26/13 0357  WBC 8.9 5.4  NEUTROABS 5.8  --   HGB 14.9 14.5  HCT 43.8 43.2  MCV 99.8 100.5*  PLT 208 182    Cardiac Enzymes:  Recent Labs Lab 12/25/13 2010 12/25/13 2144 12/26/13 0355  TROPONINI <0.30 <0.30 <0.30    Radiology: Dg Chest Port 1 View  12/25/2013   CLINICAL DATA:  Supraventricular tachycardia with response to coughing. History of hypertension and smoking. Initial encounter.  EXAM: PORTABLE CHEST - 1 VIEW  COMPARISON:  None.  FINDINGS: The heart size and mediastinal contours are normal. The lungs are clear. There is no pleural effusion or pneumothorax. No acute osseous findings are identified.  IMPRESSION: No active cardiopulmonary process.   Electronically Signed   By: Camie Patience M.D.   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 Resolved with IV lopressor. Echocardiogram is ordered. To evaluate for LV fx and for tachycardic cardiomyopathy. I will check TSH. Consider OP stress test. He is very anxious to go home and is willing to follow up with cardiology. Would begin low dose metoprolol 12.5 mg BID. If echo abnormal then consider further cardiac testing.   Of note, he states that he is allergic to steroids, as this causes him to have rapid HR as well. He is on flonase prn for allergies. Consider discontinuing this.   2. Hypertension:  Long standing history and is medically compliant. On Vasotec 10 mg at home. Will d/c this with use of BB.    3. Ongoing tobacco abuse: Smokes a pack and a half to 2 ppd. Cessation is strongly recommended.  4. ETOH abuse: Drinks a 6 pk of beer daily, sometimes more on the weekends. Echo to evaluate for LV dysfunction in this setting as well.    Signed: Phill Myron. Lawrence NP AACC  12/26/2013, 8:38 AM Co-Sign MD  Patient seen and discussed with NP Leonard Armstrong. 58 yo male hx of HTN, tobacco abuse admitted with palpitations. Reports long history of palpitations for several years, typically episodes short in duration. Longer than  normal episode prior to admission, per hospital notes evaluated by EMS and found to be in SVT. He reportedly converted once with vagal maneuvers and then reverted, then converted again with IV metoprolol.     TSH pending, Mg 2.3, Hgb 14.9, Plt 208, trop neg x 3, K 3.8, Cr 0.83 CXR no acute process EKG narrow complex regular tachycardia rates 175, pseudo R prime in V1 suggestive of AVNRT. 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 Agree with beta blocker therapy and decreased EtOH intake. F/u TSH and echo.  - Lateral ST depressions noted on EKG with rates in 180s, resolved with heart rate control. Essentially this is an abnormal stress test at 110% of THR. He denies any history of chest pain, no significant DOE or LE edema. He does have CAD risk factors including age, tobacco, HTN. Baseline EKG suggests possible old anteroseptal infarct. F/u echo, likely would consider outpatient pharmacologic (cannot exercise due to chronic back pain) to further evaluate.    Zandra Abts MD

## 2013-12-26 NOTE — Discharge Summary (Signed)
Physician Discharge Summary  Leonard Armstrong CZY:[id number] DOB: 18-Oct-1955 DOA: 12/25/2013  PCP: Rubbie Battiest, MD  Admit date: 12/25/2013 Discharge date: 12/26/2013  Time spent: 25 minutes  Recommendations for Outpatient Follow-up:  1. Discharge home with outpatient PCP and cardiology follow-up. Follow TSH result.   Discharge Diagnoses:  Principal Problem:   Tachycardia   Active Problems:   HTN (hypertension)   Nicotine dependence  Alcohol abuse   Discharge Condition: Fair  Diet recommendation: Low-sodium    Filed Weights   12/25/13 2007 12/25/13 2209  Weight: 79.379 kg (175 lb) 80.74 kg (178 lb)    History of present illness:  58 year old male with heavy tobacco use, HTN, etoh use and tachycardia with palpitations for several years (almost 20 years and was placed on beta blocker in the past ) presented with palpitations. Patient was sitting at home and watching TV when he felt his heart racing. Patient reports these symptoms have all her frequently but do not last for very long time. Patient called EMS and had EKG done which was reportedly showing SVTs with heart rate in 180s.. Patient denies any chest pain, shortness of breath, dizziness, headache, blurred vision, abdominal pain, nausea, vomiting, bowel or urinary symptoms. Denies any fevers or chills. Didn't report being able to reduce his heart rate by performing vagal maneuvers. On arrival to the ED his heart rate again went up to 180s. Showed SVT on the monitor. EKG showed AVRT with anterolateral T-wave depression. was given 5 mg IV Lopressor.  Patient was prepared to be given at [facility] several times but spontaneously returned to normal rate. Subsequently was stable in normal sinus rhythm and admitted to hospitalist service .   Hospital Course:  Supraventricular tachycardia Patient stable on monitor. Negative. No further episodes. Electrolytes have been normal the unremarkable. Clear etiology except for heavy smoking,  alcohol use. Patient has a follow-up.   TSH has been ordered. -2-D echo done normal EF and no wall motion abnormality. Started on low-dose metoprolol 12.5 mg twice daily -Patient cleared for discharge as per cardiology with outpatient follow-up and planned for stress test.( given lateral ST depression on EKG during  SVT)  Tobacco and alcohol use Counseled extensively on cessation. Offered nicotine  patch but  reports that has not helped and would like to try some after discussing with his PCP. Asked to avoid caffeine as well.  Hypertension Hold Vasotec as now started on metoprolol. Follow-up as outpatient  Procedures:  2-D echo   Consultations:  Cardiology   Discharge Exam: Filed Vitals:   12/26/13 1501  BP: 139/81  Pulse: 85  Temp: 98.5 F (36.9 C)  Resp: 18    General:We did aged male in no acute distress HEENT: No pallor, moist oral mucosa Chest: Clear bilaterally, no added sounds CVS: Normal S1-S2, no murmurs rub or gallop Abdomen: Soft, non- distended,  non-tender Extremities :warm, no edema CNS alert and oriented, no tremors   Discharge Instructions    Current Discharge Medication List    START taking these medications   Details  metoprolol tartrate (LOPRESSOR) 12.5 mg TABS tablet Take 0.5 tablets (12.5 mg total) by mouth 2 (two) times daily. Qty: 60 tablet, Refills: 0      CONTINUE these medications which have NOT CHANGED   Details  acetaminophen (TYLENOL) 500 MG tablet Take 1,000 mg by mouth every 6 (six) hours as needed for mild pain.      STOP taking these medications     enalapril (VASOTEC) 10 MG tablet  fluticasone (FLONASE) 50 MCG/ACT nasal spray        Allergies  Allergen Reactions  . Other Other (See Comments)    Steroids- not in right state of mind. Makes vital signs erratic.   Marland Kitchen Prednisone Other (See Comments)    Not in right state of mind, makes vital signs erratic.   Marland Kitchen Wasp Venom Swelling   Follow-up Information    Follow  up with Jory Sims, NP On 01/16/2014.   Specialty:  Nurse Practitioner   Why:  at 2:30 pm   Contact information:   [place] [address] [phone]       Follow up with Rubbie Battiest, MD. Schedule an appointment as soon as possible for a visit in 1 week.   Specialty:  Family Medicine   Contact information:   [address] [place] [address] [phone]        The results of significant diagnostics from this hospitalization (including imaging, microbiology, ancillary and laboratory) are listed below for reference.    Significant Diagnostic Studies: Dg Chest Port 1 View  12/25/2013   CLINICAL DATA:  Supraventricular tachycardia with response to coughing. History of hypertension and smoking. Initial encounter.  EXAM: PORTABLE CHEST - 1 VIEW  COMPARISON:  None.  FINDINGS: The heart size and mediastinal contours are normal. The lungs are clear. There is no pleural effusion or pneumothorax. No acute osseous findings are identified.  IMPRESSION: No active cardiopulmonary process.   Electronically Signed   By: Camie Patience M.D.   On: 12/25/2013 20:22    Microbiology: No results found for this or any previous visit (from the past 240 hour(s)).   Labs: Basic Metabolic Panel:  Recent Labs Lab 12/25/13 0350 12/25/13 2010 12/26/13 0357  NA  --  137 140  K  --  3.8 4.0  CL  --  100 105  CO2  --  24 22  GLUCOSE  --  95 92  BUN  --  15 11  CREATININE  --  0.83 0.72  CALCIUM  --  9.2 8.6  MG 2.3 2.1  --   PHOS 3.8  --   --    Liver Function Tests: No results for input(s): AST, ALT, ALKPHOS, BILITOT, PROT, ALBUMIN in the last 168 hours. No results for input(s): LIPASE, AMYLASE in the last 168 hours. No results for input(s): AMMONIA in the last 168 hours. CBC:  Recent Labs Lab 12/25/13 2010 12/26/13 0357  WBC 8.9 5.4  NEUTROABS 5.8  --   HGB 14.9 14.5  HCT 43.8 43.2  MCV 99.8 100.5*  PLT 208 182   Cardiac Enzymes:  Recent Labs Lab  12/25/13 2010 12/25/13 2144 12/26/13 0355 12/26/13 0930  TROPONINI <0.30 <0.30 <0.30 <0.30   BNP: BNP (last 3 results) No results for input(s): PROBNP in the last 8760 hours. CBG: No results for input(s): GLUCAP in the last 168 hours.     SignedLouellen Molder  [facility] 12/26/2013, 3:14 PM

## 2013-12-26 NOTE — Progress Notes (Signed)
*  PRELIMINARY RESULTS* Echocardiogram 2D Echocardiogram has been performed.  Leonard Armstrong 12/26/2013, 1:53 PM

## 2013-12-27 LAB — TSH: TSH: 1.954 u[IU]/mL (ref 0.350–4.500)

## 2014-01-02 ENCOUNTER — Other Ambulatory Visit: Payer: Self-pay | Admitting: Family Medicine

## 2014-01-05 ENCOUNTER — Encounter: Payer: Self-pay | Admitting: Family Medicine

## 2014-01-05 ENCOUNTER — Ambulatory Visit (INDEPENDENT_AMBULATORY_CARE_PROVIDER_SITE_OTHER): Payer: BC Managed Care – PPO | Admitting: Family Medicine

## 2014-01-05 VITALS — BP 154/90 | Ht 70.0 in | Wt 173.0 lb

## 2014-01-05 DIAGNOSIS — I10 Essential (primary) hypertension: Secondary | ICD-10-CM

## 2014-01-05 DIAGNOSIS — I471 Supraventricular tachycardia: Secondary | ICD-10-CM

## 2014-01-05 NOTE — Progress Notes (Signed)
   Subjective:    Patient ID: Leonard Armstrong, male    DOB: [birth year], 58 y.o.   MRN: [id number]  HPI Patient is here today for an ER follow up. He went on 12/20 for tachycardia.  Patient said he feels better now. They switched his BP med from Enalapril to Metoprolol.   Pt checks his BP every day now. They run around 136/88.  Remodeling his kitchen.   All hospital records reviewed and presence of patient. Next  Had an echo cardiogram essentially normal for age.  Has been taking metoprolol 12.5 twice per day. Still having elevated blood pressure. This concerns him.  No true chest pain. Due to see cardiologist in.  Unfortunately continues to smoke. Notes occasional shortness of breath.  Review of Systems No headache no chest pain no back pain no abdominal pain no change in bowel habits no lightheadedness    Objective:   Physical Exam Alert no acute distress. Vitals stable. Blood pressure 142/88 on repeat. HEENT normal. Lungs clear. Heart regular in rhythm.       Assessment & Plan:  Impression 1 hypertension suboptimal control #2 status post tachyarrhythmia. SVT. #3 chronic smoker discussed plan 25 minutes spent most in discussion and assessment. Increase metoprolol 25 twice a day. Stop smoking. Follow-up with cardiologist as scheduled. Warning signs discussed. WSL

## 2014-01-16 ENCOUNTER — Encounter: Payer: BC Managed Care – PPO | Admitting: Adult Health

## 2014-01-19 ENCOUNTER — Encounter: Payer: Self-pay | Admitting: Adult Health

## 2014-01-19 ENCOUNTER — Ambulatory Visit (INDEPENDENT_AMBULATORY_CARE_PROVIDER_SITE_OTHER): Payer: BLUE CROSS/BLUE SHIELD | Admitting: Adult Health

## 2014-01-19 VITALS — BP 142/92 | HR 90 | Ht 71.0 in | Wt 177.0 lb

## 2014-01-19 DIAGNOSIS — R002 Palpitations: Secondary | ICD-10-CM

## 2014-01-19 DIAGNOSIS — I10 Essential (primary) hypertension: Secondary | ICD-10-CM

## 2014-01-19 DIAGNOSIS — R000 Tachycardia, unspecified: Secondary | ICD-10-CM

## 2014-01-19 NOTE — Progress Notes (Deleted)
Name: Leonard Armstrong    DOB: 07-Dec-1955  Age: 59 y.o.  MR#: [id number]       PCP:  Rubbie Battiest, MD      Insurance: Payor: [place] / Plan: BCBS OTHER / Product Type: *No Product type* /   CC:    Chief Complaint  Patient presents with  . Hypertension  . Tachycardia    VS Filed Vitals:   01/19/14 1554  BP: 142/92  Pulse: 90  Height: 5\' 11"  (1.803 m)  Weight: 177 lb (80.287 kg)  SpO2: 96%    Weights Current Weight  01/19/14 177 lb (80.287 kg)  01/05/14 173 lb (78.472 kg)  12/25/13 178 lb (80.74 kg)    Blood Pressure  BP Readings from Last 3 Encounters:  01/19/14 142/92  01/05/14 154/90  12/26/13 139/81     Admit date:  (Not on file) Last encounter with RMR:  Visit date not found   Allergy Other; Prednisone; and Wasp venom  Current Outpatient Prescriptions  Medication Sig Dispense Refill  . acetaminophen (TYLENOL) 500 MG tablet Take 1,000 mg by mouth every 6 (six) hours as needed for mild pain.    . metoprolol tartrate (LOPRESSOR) 12.5 mg TABS tablet Take 0.5 tablets (12.5 mg total) by mouth 2 (two) times daily. 60 tablet 0  . sildenafil (VIAGRA) 100 MG tablet Take 100 mg by mouth as needed for erectile dysfunction.    Marland Kitchen HYDROcodone-homatropine (HYCODAN) 5-1.5 MG/5ML syrup   0   No current facility-administered medications for this visit.    Discontinued Meds:   There are no discontinued medications.  Patient Active Problem List   Diagnosis Date Noted  . ETOH abuse 12/26/2013  . Sustained SVT   . AVNRT (AV nodal re-entry tachycardia)   . Tachycardia 12/25/2013  . Nicotine dependence 12/25/2013  . HTN (hypertension) 06/08/2012  . Allergic rhinitis 06/08/2012    LABS    Component Value Date/Time   NA 140 12/26/2013 0357   NA 137 12/25/2013 2010   K 4.0 12/26/2013 0357   K 3.8 12/25/2013 2010   CL 105 12/26/2013 0357   CL 100 12/25/2013 2010   CO2 22 12/26/2013 0357   CO2 24 12/25/2013 2010   GLUCOSE 92 12/26/2013 0357   GLUCOSE 95  12/25/2013 2010   BUN 11 12/26/2013 0357   BUN 15 12/25/2013 2010   CREATININE 0.72 12/26/2013 0357   CREATININE 0.83 12/25/2013 2010   CALCIUM 8.6 12/26/2013 0357   CALCIUM 9.2 12/25/2013 2010   GFRNONAA >90 12/26/2013 0357   GFRNONAA >90 12/25/2013 2010   GFRAA >90 12/26/2013 0357   GFRAA >90 12/25/2013 2010   CMP     Component Value Date/Time   NA 140 12/26/2013 0357   K 4.0 12/26/2013 0357   CL 105 12/26/2013 0357   CO2 22 12/26/2013 0357   GLUCOSE 92 12/26/2013 0357   BUN 11 12/26/2013 0357   CREATININE 0.72 12/26/2013 0357   CALCIUM 8.6 12/26/2013 0357   GFRNONAA >90 12/26/2013 0357   GFRAA >90 12/26/2013 0357       Component Value Date/Time   WBC 5.4 12/26/2013 0357   WBC 8.9 12/25/2013 2010   HGB 14.5 12/26/2013 0357   HGB 14.9 12/25/2013 2010   HCT 43.2 12/26/2013 0357   HCT 43.8 12/25/2013 2010   MCV 100.5* 12/26/2013 0357   MCV 99.8 12/25/2013 2010    Lipid Panel  No results found for: CHOL, TRIG, HDL, CHOLHDL, VLDL, LDLCALC, LDLDIRECT  ABG  No results found for: PHART, PCO2ART, PO2ART, HCO3, TCO2, ACIDBASEDEF, O2SAT   Lab Results  Component Value Date   TSH 1.954 12/25/2013   BNP (last 3 results) No results for input(s): PROBNP in the last 8760 hours. Cardiac Panel (last 3 results) No results for input(s): CKTOTAL, CKMB, TROPONINI, RELINDX in the last 72 hours.  Iron/TIBC/Ferritin/ %Sat No results found for: IRON, TIBC, FERRITIN, IRONPCTSAT   EKG Orders placed or performed during the hospital encounter of 12/25/13  . EKG 12-Lead  . EKG 12-Lead  . Repeat EKG  . Repeat EKG  . EKG 12-Lead  . EKG 12-Lead  . EKG 12-Lead  . EKG 12-Lead  . EKG     Prior Assessment and Plan Problem List as of 01/19/2014      Cardiovascular and Mediastinum   HTN (hypertension)   Sustained SVT   AVNRT (AV nodal re-entry tachycardia)     Respiratory   Allergic rhinitis     Other   Tachycardia   Nicotine dependence   ETOH abuse       Imaging: Dg  Chest Port 1 View  12/25/2013   CLINICAL DATA:  Supraventricular tachycardia with response to coughing. History of hypertension and smoking. Initial encounter.  EXAM: PORTABLE CHEST - 1 VIEW  COMPARISON:  None.  FINDINGS: The heart size and mediastinal contours are normal. The lungs are clear. There is no pleural effusion or pneumothorax. No acute osseous findings are identified.  IMPRESSION: No active cardiopulmonary process.   Electronically Signed   By: Camie Patience M.D.   On: 12/25/2013 20:22

## 2014-01-19 NOTE — Assessment & Plan Note (Signed)
Blood pressure is currently moderately controlled.  He is only on a beta blocker.  Can consider adding lipase ACE inhibitor.  He is to be set up for a LexiScan Cardiolite stress test to evaluate whether hypertension is related to ischemic etiology. He is unable to walk on a treadmill due to severe back injury.  We will see him back in the office to discuss test results and make further recommendations on this medication regimen.

## 2014-01-19 NOTE — Progress Notes (Signed)
    HPI: Mr. Glander a 59 year old patient of Dr. Harl Bowie that was originally seen on consultation, in the setting of hypertension, and tachycardia with palpitations.  The patient was also found to have a history of EtOH abuse.      On initial evaluation.  He was found to have SVT with a heart rate 180 beats per minute without associated symptoms.  He was found to be AVRT with inferior lateral T-wave depression.  The patient was started on metoprolol 12.5 mg twice a day, and was planned for an outpatient stress test.  He was counseled extensively on cessation of tobacco and alcohol use.   The patient comes today having seen his primary care physician, who increased his metoprolol to 25 mg twice a day as the patient was experiencing rapid palpitations.  He was unable to tolerate sitting became very lightheaded and dizzy and weak.  He went back to taking 12.5 mg  mg twice a day.he is cutting down on smoking and alcohol.  He denies any chest pain, but is having occasional rapid heart rhythm, which lasts seconds only.  Allergies  Allergen Reactions  . Other Other (See Comments)    Steroids- not in right state of mind. Makes vital signs erratic.   Marland Kitchen Prednisone Other (See Comments)    Not in right state of mind, makes vital signs erratic.   Marland Kitchen Wasp Venom Swelling    Current Outpatient Prescriptions  Medication Sig Dispense Refill  . acetaminophen (TYLENOL) 500 MG tablet Take 1,000 mg by mouth every 6 (six) hours as needed for mild pain.    . metoprolol tartrate (LOPRESSOR) 12.5 mg TABS tablet Take 0.5 tablets (12.5 mg total) by mouth 2 (two) times daily. 60 tablet 0  . sildenafil (VIAGRA) 100 MG tablet Take 100 mg by mouth as needed for erectile dysfunction.    Marland Kitchen HYDROcodone-homatropine (HYCODAN) 5-1.5 MG/5ML syrup   0   No current facility-administered medications for this visit.    Past Medical History  Diagnosis Date  . Back pain   . Hypertension   . Loss of smell   . Allergy   . Tachycardia      Past Surgical History  Procedure Laterality Date  . Back surgery    . Vasectomy    . Hernia repair      ROS: Complete review of systems performed and found to be negative unless outlined above  PHYSICAL EXAM BP 142/92 mmHg  Pulse 90  Ht 5\' 11"  (1.803 m)  Wt 177 lb (80.287 kg)  BMI 24.70 kg/m2  SpO2 96%  General: Well developed, well nourished, in no acute distress Head: Eyes PERRLA, No xanthomas.   Normal cephalic and atramatic  Lungs: Clear bilaterally to auscultation and percussion. Heart: HRRR S1 S2, without MRG.  Pulses are 2+ & equal.            No carotid bruit. No JVD.  No abdominal bruits. No femoral bruits. Abdomen: Bowel sounds are positive, abdomen soft and non-tender without masses or                  Hernia's noted. Msk:  Back normal, normal gait. Normal strength and tone for age. Extremities: No clubbing, cyanosis or edema.  DP +1 Neuro: Alert and oriented X 3. Psych:  Good affect, responds appropriately   ASSESSMENT AND PLAN

## 2014-01-19 NOTE — Assessment & Plan Note (Signed)
He is advised on smoking cessation again.  He states he is doing his best to quit, but has been very difficult.  I have encouraged him to continue to try.

## 2014-01-19 NOTE — Assessment & Plan Note (Signed)
He was unable to tolerate increased dose of metoprolol from 12.5 mg twice a day to 25 mg twice a day, as this is causing extreme fatigue, lightheadedness, and weakness.  He is tolerating the lower dose much better, with occasional rapid heart rhythm, lasting several seconds, and going away on its own.  He unfortunately continues to smoke and drink alcohol.  His TSH was checked during hospitalization and found to be within normal limits.  I have advised him to stop smoking and nicotine can be contributing to his rapid heart rhythm, and also to decrease his EtOH use.  He is not drinking caffeine.

## 2014-01-19 NOTE — Patient Instructions (Signed)
Your physician recommends that you schedule a follow-up appointment in: after [facility] has requested that you have a lexiscan myoview. For further information please visit [email or web address]. Please follow instruction sheet, as given.     Your physician recommends that you continue on your current medications as directed. Please refer to the Current Medication list given to you today.    Thank you for choosing [facility] !

## 2014-01-25 ENCOUNTER — Encounter (HOSPITAL_COMMUNITY)
Admission: RE | Admit: 2014-01-25 | Discharge: 2014-01-25 | Disposition: A | Discharge status: Home / Self Care | Payer: BLUE CROSS/BLUE SHIELD | Source: Ambulatory Visit | Attending: Adult Health | Admitting: Adult Health

## 2014-01-25 ENCOUNTER — Ambulatory Visit (HOSPITAL_COMMUNITY)
Admission: RE | Admit: 2014-01-25 | Discharge: 2014-01-25 | Disposition: A | Discharge status: Home / Self Care | Payer: BLUE CROSS/BLUE SHIELD | Source: Ambulatory Visit | Attending: Adult Health | Admitting: Adult Health

## 2014-01-25 ENCOUNTER — Encounter (HOSPITAL_COMMUNITY): Payer: Self-pay

## 2014-01-25 DIAGNOSIS — I472 Ventricular tachycardia: Secondary | ICD-10-CM | POA: Insufficient documentation

## 2014-01-25 DIAGNOSIS — R000 Tachycardia, unspecified: Secondary | ICD-10-CM | POA: Insufficient documentation

## 2014-01-25 DIAGNOSIS — I10 Essential (primary) hypertension: Secondary | ICD-10-CM | POA: Diagnosis not present

## 2014-01-25 DIAGNOSIS — R002 Palpitations: Secondary | ICD-10-CM

## 2014-01-25 DIAGNOSIS — Z720 Tobacco use: Secondary | ICD-10-CM | POA: Insufficient documentation

## 2014-01-25 HISTORY — DX: Malignant (primary) neoplasm, unspecified: C80.1

## 2014-01-25 MED ORDER — SODIUM CHLORIDE 0.9 % IJ SOLN
10.0000 mL | INTRAMUSCULAR | Status: DC | PRN
  Administered 2014-01-25: 10 mL via INTRAVENOUS
  Filled 2014-01-25: qty 10

## 2014-01-25 MED ORDER — REGADENOSON 0.4 MG/5ML IV SOLN
0.4000 mg | Freq: Once | INTRAVENOUS | Status: AC | PRN
  Administered 2014-01-25: 0.4 mg via INTRAVENOUS

## 2014-01-25 MED ORDER — REGADENOSON 0.4 MG/5ML IV SOLN
INTRAVENOUS | Status: AC
  Administered 2014-01-25: 0.4 mg via INTRAVENOUS
  Filled 2014-01-25: qty 5

## 2014-01-25 MED ORDER — TECHNETIUM TC 99M SESTAMIBI - CARDIOLITE
10.0000 | Freq: Once | INTRAVENOUS | Status: AC | PRN
  Administered 2014-01-25: 08:00:00 10 via INTRAVENOUS

## 2014-01-25 MED ORDER — SODIUM CHLORIDE 0.9 % IJ SOLN
INTRAMUSCULAR | Status: AC
  Administered 2014-01-25: 10 mL via INTRAVENOUS
  Filled 2014-01-25: qty 3

## 2014-01-25 MED ORDER — TECHNETIUM TC 99M SESTAMIBI GENERIC - CARDIOLITE
30.0000 | Freq: Once | INTRAVENOUS | Status: AC | PRN
  Administered 2014-01-25: 30 via INTRAVENOUS

## 2014-01-25 NOTE — Progress Notes (Signed)
Stress Lab Nurses Notes - Forestine Na  CUNG MASTERSON 01/25/2014 Reason for doing test: Tachycardia Type of test: Wille Glaser Nurse performing test: Gerrit Halls, RN Nuclear Medicine Tech: Melburn Hake Echo Tech: Not Applicable MD performing test: S. McDowell/M.Bonnell Public PA Family MD: Louis Matte Test explained and consent signed: Yes.   IV started: Saline lock flushed, No redness or edema and Saline lock started in radiology Symptoms: dizziness & SOB Treatment/Intervention: None Reason test stopped: protocol completed After recovery IV was: Discontinued via X-ray tech and No redness or edema Patient to return to Nuc. Med at : 10:15 Patient discharged: Home Patient's Condition upon discharge was: stable Comments: During test BP 139/74 & HR 112.  Recovery BP 132/72 & HR 91.  Symptoms resolved in recovery.  Geanie Cooley T

## 2014-01-30 ENCOUNTER — Encounter: Payer: Self-pay | Admitting: Adult Health

## 2014-01-30 ENCOUNTER — Ambulatory Visit (INDEPENDENT_AMBULATORY_CARE_PROVIDER_SITE_OTHER): Payer: BLUE CROSS/BLUE SHIELD | Admitting: Adult Health

## 2014-01-30 VITALS — BP 130/92 | HR 83 | Ht 71.0 in | Wt 178.0 lb

## 2014-01-30 DIAGNOSIS — I10 Essential (primary) hypertension: Secondary | ICD-10-CM

## 2014-01-30 DIAGNOSIS — I471 Supraventricular tachycardia: Secondary | ICD-10-CM

## 2014-01-30 DIAGNOSIS — R000 Tachycardia, unspecified: Secondary | ICD-10-CM

## 2014-01-30 DIAGNOSIS — I4719 Other supraventricular tachycardia: Secondary | ICD-10-CM

## 2014-01-30 MED ORDER — METOPROLOL TARTRATE 25 MG PO TABS: 12.5000 mg | ORAL_TABLET | Freq: Two times a day (BID) | ORAL | Status: DC

## 2014-01-30 NOTE — Assessment & Plan Note (Signed)
Blood pressure is very well controlled.  I will not make any changes on this medication regimen at this time.  He is advised on low sodium diet.  Tobacco cessation is strongly recommended.

## 2014-01-30 NOTE — Assessment & Plan Note (Signed)
As stated, beta blocker therapy, is keeping his heart rate under good control.  We will see him in 6 months.  If he has recurrence of symptoms, may need to refer him to the electrophysiology.  Stress test was reassuring concerning ischemic etiology of his symptoms.

## 2014-01-30 NOTE — Progress Notes (Signed)
    HPI: Leonard Armstrong is a 59 year old patient of Dr. Harl Bowie that we follow for ongoing assessment and management of hypertension, and tachycardia.  The patient has history of EtOH abuse, and frequent palpitations.  He did have one episode of SVT while hospitalized in December 2016.  This was found to be AVRT.  He was started on metoprolol 25 mg twice a day.  He was unable to tolerate the dose of metoprolol.at the time of last office visit.  His blood pressure was not well controlled and we considered adding ACE inhibitor.  We planned a cardiac stress test to evaluate for ischemic etiology of difficult to control hypertension.he was found to have a low risk stress test.     Leonard Armstrong is doing well.  He has had no recurrence of rapid heart rhythm.  He is tolerating the lower doses metoprolol.  He unfortunately continues to smoke.  Allergies  Allergen Reactions  . Other Other (See Comments)    Steroids- not in right state of mind. Makes vital signs erratic.   Marland Kitchen Prednisone Other (See Comments)    Not in right state of mind, makes vital signs erratic.   Marland Kitchen Wasp Venom Swelling    Current Outpatient Prescriptions  Medication Sig Dispense Refill  . acetaminophen (TYLENOL) 500 MG tablet Take 1,000 mg by mouth every 6 (six) hours as needed for mild pain.    Marland Kitchen HYDROcodone-homatropine (HYCODAN) 5-1.5 MG/5ML syrup   0  . metoprolol tartrate (LOPRESSOR) 25 MG tablet Take 0.5 tablets (12.5 mg total) by mouth 2 (two) times daily. 60 tablet 6  . sildenafil (VIAGRA) 100 MG tablet Take 100 mg by mouth as needed for erectile dysfunction.     No current facility-administered medications for this visit.    Past Medical History  Diagnosis Date  . Back pain   . Hypertension   . Loss of smell   . Allergy   . Tachycardia   . Cancer     Skin    Past Surgical History  Procedure Laterality Date  . Back surgery    . Vasectomy    . Hernia repair      MCN:[id number] review of systems performed and found to be  negative unless outlined above  PHYSICAL EXAM BP 130/92 mmHg  Pulse 83  Ht 5\' 11"  (1.803 m)  Wt 178 lb (80.74 kg)  BMI 24.84 kg/m2 General: Well developed, well nourished, in no acute distress Head: Eyes PERRLA, No xanthomas.   Normal cephalic and atramatic  Lungs: Clear bilaterally to auscultation and percussion. Heart: HRRR S1 S2, without MRG.  Pulses are 2+ & equal.            No carotid bruit. No JVD.  No abdominal bruits. No femoral bruits. Abdomen: Bowel sounds are positive, abdomen soft and non-tender without masses or                  Hernia's noted. Msk:  Back normal, normal gait. Normal strength and tone for age. Extremities: No clubbing, cyanosis or edema.  DP +1 Neuro: Alert and oriented X 3. Psych:  Good affect, responds appropriately  ASSESSMENT AND PLAN

## 2014-01-30 NOTE — Progress Notes (Deleted)
Name: Leonard MCCLISH    DOB: 1955-12-19  Age: 59 y.o.  MR#: [id number]       PCP:  Rubbie Battiest, MD      Insurance: Payor: [place] / Plan: BCBS OTHER / Product Type: *No Product type* /   CC:    Chief Complaint  Patient presents with  . Hypertension  . Tachycardia    VS Filed Vitals:   01/30/14 1538  BP: 130/92  Pulse: 83  Height: 5\' 11"  (1.803 m)  Weight: 178 lb (80.74 kg)    Weights Current Weight  01/30/14 178 lb (80.74 kg)  01/19/14 177 lb (80.287 kg)  01/05/14 173 lb (78.472 kg)    Blood Pressure  BP Readings from Last 3 Encounters:  01/30/14 130/92  01/19/14 142/92  01/05/14 154/90     Admit date:  (Not on file) Last encounter with RMR:  01/19/2014   Allergy Other; Prednisone; and Wasp venom  Current Outpatient Prescriptions  Medication Sig Dispense Refill  . acetaminophen (TYLENOL) 500 MG tablet Take 1,000 mg by mouth every 6 (six) hours as needed for mild pain.    Marland Kitchen HYDROcodone-homatropine (HYCODAN) 5-1.5 MG/5ML syrup   0  . metoprolol tartrate (LOPRESSOR) 12.5 mg TABS tablet Take 0.5 tablets (12.5 mg total) by mouth 2 (two) times daily. 60 tablet 0  . sildenafil (VIAGRA) 100 MG tablet Take 100 mg by mouth as needed for erectile dysfunction.     No current facility-administered medications for this visit.    Discontinued Meds:   There are no discontinued medications.  Patient Active Problem List   Diagnosis Date Noted  . ETOH abuse 12/26/2013  . Sustained SVT   . AVNRT (AV nodal re-entry tachycardia)   . Tachycardia 12/25/2013  . Nicotine dependence 12/25/2013  . HTN (hypertension) 06/08/2012  . Allergic rhinitis 06/08/2012    LABS    Component Value Date/Time   NA 140 12/26/2013 0357   NA 137 12/25/2013 2010   K 4.0 12/26/2013 0357   K 3.8 12/25/2013 2010   CL 105 12/26/2013 0357   CL 100 12/25/2013 2010   CO2 22 12/26/2013 0357   CO2 24 12/25/2013 2010   GLUCOSE 92 12/26/2013 0357   GLUCOSE 95 12/25/2013 2010   BUN 11  12/26/2013 0357   BUN 15 12/25/2013 2010   CREATININE 0.72 12/26/2013 0357   CREATININE 0.83 12/25/2013 2010   CALCIUM 8.6 12/26/2013 0357   CALCIUM 9.2 12/25/2013 2010   GFRNONAA >90 12/26/2013 0357   GFRNONAA >90 12/25/2013 2010   GFRAA >90 12/26/2013 0357   GFRAA >90 12/25/2013 2010   CMP     Component Value Date/Time   NA 140 12/26/2013 0357   K 4.0 12/26/2013 0357   CL 105 12/26/2013 0357   CO2 22 12/26/2013 0357   GLUCOSE 92 12/26/2013 0357   BUN 11 12/26/2013 0357   CREATININE 0.72 12/26/2013 0357   CALCIUM 8.6 12/26/2013 0357   GFRNONAA >90 12/26/2013 0357   GFRAA >90 12/26/2013 0357       Component Value Date/Time   WBC 5.4 12/26/2013 0357   WBC 8.9 12/25/2013 2010   HGB 14.5 12/26/2013 0357   HGB 14.9 12/25/2013 2010   HCT 43.2 12/26/2013 0357   HCT 43.8 12/25/2013 2010   MCV 100.5* 12/26/2013 0357   MCV 99.8 12/25/2013 2010    Lipid Panel  No results found for: CHOL, TRIG, HDL, CHOLHDL, VLDL, LDLCALC, LDLDIRECT  ABG No results found for: PHART, PCO2ART,  PO2ART, HCO3, TCO2, ACIDBASEDEF, O2SAT   Lab Results  Component Value Date   TSH 1.954 12/25/2013   BNP (last 3 results) No results for input(s): PROBNP in the last 8760 hours. Cardiac Panel (last 3 results) No results for input(s): CKTOTAL, CKMB, TROPONINI, RELINDX in the last 72 hours.  Iron/TIBC/Ferritin/ %Sat No results found for: IRON, TIBC, FERRITIN, IRONPCTSAT   EKG Orders placed or performed during the hospital encounter of 12/25/13  . EKG 12-Lead  . EKG 12-Lead  . Repeat EKG  . Repeat EKG  . EKG 12-Lead  . EKG 12-Lead  . EKG 12-Lead  . EKG 12-Lead  . EKG     Prior Assessment and Plan Problem List as of 01/30/2014      Cardiovascular and Mediastinum   HTN (hypertension)   Last Assessment & Plan 01/19/2014 Office Visit Written 01/19/2014  5:09 PM by Lendon Colonel, NP    Blood pressure is currently moderately controlled.  He is only on a beta blocker.  Can consider adding  lipase ACE inhibitor.  He is to be set up for a LexiScan Cardiolite stress test to evaluate whether hypertension is related to ischemic etiology. He is unable to walk on a treadmill due to severe back injury.  We will see him back in the office to discuss test results and make further recommendations on this medication regimen.      Sustained SVT   AVNRT (AV nodal re-entry tachycardia)     Respiratory   Allergic rhinitis     Other   Tachycardia   Last Assessment & Plan 01/19/2014 Office Visit Written 01/19/2014  5:07 PM by Lendon Colonel, NP    He was unable to tolerate increased dose of metoprolol from 12.5 mg twice a day to 25 mg twice a day, as this is causing extreme fatigue, lightheadedness, and weakness.  He is tolerating the lower dose much better, with occasional rapid heart rhythm, lasting several seconds, and going away on its own.  He unfortunately continues to smoke and drink alcohol.  His TSH was checked during hospitalization and found to be within normal limits.  I have advised him to stop smoking and nicotine can be contributing to his rapid heart rhythm, and also to decrease his EtOH use.  He is not drinking caffeine.      Nicotine dependence   Last Assessment & Plan 01/19/2014 Office Visit Written 01/19/2014  5:09 PM by Lendon Colonel, NP    He is advised on smoking cessation again.  He states he is doing his best to quit, but has been very difficult.  I have encouraged him to continue to try.      ETOH abuse       Imaging: Nm Myocar Multi W/spect W/wall Motion / Ef  01/25/2014   CLINICAL DATA:  59 year old male with history of hypertension, PSVT, and tobacco use. The study is requested to evaluate for the presence and extent of ischemia.  EXAM: MYOCARDIAL IMAGING WITH SPECT (REST AND PHARMACOLOGIC-STRESS)  GATED LEFT VENTRICULAR WALL MOTION STUDY  LEFT VENTRICULAR EJECTION FRACTION  TECHNIQUE: Standard myocardial SPECT imaging was performed after resting intravenous  injection of 10 mCi Tc-32m sestamibi. Subsequently, intravenous infusion of Lexiscan was performed under the supervision of the Cardiology staff. At peak effect of the drug, 30 mCi Tc-93m sestamibi was injected intravenously and standard myocardial SPECT imaging was performed. Quantitative gated imaging was also performed to evaluate left ventricular wall motion, and estimate left ventricular ejection fraction.  FINDINGS: Baseline tracing shows normal sinus rhythm at 71 beats per min. Lexiscan bolus was given in standard fashion. Heart rate increased from 70 beats per min up to 113 beats per min, and blood pressure increased from 130/83 up to 139/74. No chest pain was reported. There are no diagnostic ST segment abnormalities, and no arrhythmias were noted.  Analysis of the overall perfusion data finds adequate myocardial radiotracer uptake. Gut uptake is noted near the inferior wall toward the apex.  Perfusion: Moderate-sized, mild to moderate intensity, apical to basal inferior wall defect that is predominantly fixed. Appears to be partially reversible near the region of adjacent gut uptake, likely artifactual. This is most suggestive of diaphragmatic attenuation.  Wall Motion: Normal left ventricular wall motion. No left ventricular dilation.  Left Ventricular Ejection Fraction: 69 %  End diastolic volume 95 ml  End systolic volume 29 ml  IMPRESSION: 1. Diaphragmatic attenuation noted without clear evidence of scar or significant ischemia.  2. Normal left ventricular wall motion.  3. Left ventricular ejection fraction 69%  4. Low-risk stress test findings*.  *2012 Appropriate Use Criteria for Coronary Revascularization Focused Update: J Am Coll Cardiol. 6659;93(5):701-779. http://content.airportbarriers.com.aspx?articleid=1201161   Electronically Signed   By: Rozann Lesches M.D.   On: 01/25/2014 14:57

## 2014-01-30 NOTE — Assessment & Plan Note (Signed)
Heart rate and blood pressure currently well-controlled.  He is tolerating the beta blocker, without fatigue.  We will leave him on a 12.5 mg twice a day.  I have counseled him on smoking cessation and alcohol use.  We will see him again in 6 months unless he becomes symptomatic.

## 2014-01-30 NOTE — Patient Instructions (Signed)
Your physician wants you to follow-up in: 6 months with K.Lawrence NP You will receive a reminder letter in the mail two months in advance. If you don't receive a letter, please call our office to schedule the follow-up appointment.   Your physician recommends that you continue on your current medications as directed. Please refer to the Current Medication list given to you today.    I refilled your metoprolol      Thank you for choosing [facility] !

## 2014-02-21 ENCOUNTER — Other Ambulatory Visit: Payer: Self-pay | Admitting: Adult Health

## 2014-02-21 ENCOUNTER — Other Ambulatory Visit: Payer: Self-pay

## 2014-02-21 NOTE — Telephone Encounter (Signed)
Refill was done on 01/30/14 and confirmed by pharmacy,no explanation by pharmacy why they resent request

## 2014-02-21 NOTE — Telephone Encounter (Signed)
Please see refill bin / tgs  °

## 2014-03-16 ENCOUNTER — Other Ambulatory Visit: Payer: Self-pay | Admitting: Adult Health

## 2014-03-16 MED ORDER — METOPROLOL TARTRATE 25 MG PO TABS
12.5000 mg | ORAL_TABLET | Freq: Two times a day (BID) | ORAL | Status: DC
Start: 2014-03-16 — End: 2015-04-25

## 2014-03-16 NOTE — Telephone Encounter (Signed)
escribed as requested 

## 2014-03-28 ENCOUNTER — Ambulatory Visit: Payer: Self-pay | Admitting: Family Medicine

## 2014-03-28 ENCOUNTER — Ambulatory Visit: Payer: BC Managed Care – PPO | Admitting: Family Medicine

## 2014-04-05 ENCOUNTER — Encounter: Payer: Self-pay | Admitting: Family Medicine

## 2014-04-05 ENCOUNTER — Ambulatory Visit (INDEPENDENT_AMBULATORY_CARE_PROVIDER_SITE_OTHER): Payer: BLUE CROSS/BLUE SHIELD | Admitting: Family Medicine

## 2014-04-05 VITALS — BP 130/88 | Ht 70.0 in | Wt 180.0 lb

## 2014-04-05 DIAGNOSIS — I10 Essential (primary) hypertension: Secondary | ICD-10-CM

## 2014-04-05 NOTE — Patient Instructions (Signed)

## 2014-04-05 NOTE — Progress Notes (Deleted)
   Subjective:    Patient ID: Leonard Armstrong, male    DOB: [birth year], 59 y.o.   MRN: [id number]  HPI This patient was seen today for chronic pain  The medication list was reviewed and updated.   -Compliance with pain medication: ***  The patient was advised the importance of maintaining medication and not using illegal substances with these.  Refills needed: ***  The patient was educated that we can provide 3 monthly scripts for their medication, it is their responsibility to follow the instructions.  Side effects or complications from medications: ***  Patient is aware that pain medications are meant to minimize the severity of the pain to allow their pain levels to improve to allow for better function. They are aware of that pain medications cannot totally remove their pain.  Due for UDT ( at least once per year) : ***        Review of Systems     Objective:   Physical Exam        Assessment & Plan:

## 2014-04-05 NOTE — Progress Notes (Signed)
   Subjective:    Patient ID: Leonard Armstrong, male    DOB: Jun 01, 1955, 59 y.o.   MRN: [id number]  Hypertension This is a chronic problem.  pt does some exercise. Does not eat healthy. Working on quitting smoking. He is down to 13 cigarettes a day.   Was told while in hospital not to use nasal sprays. Pt is having trouble with allergies.   Stress test was normal  s e's of increasing the lopressof did not work, Was kept on the same meds   bp's mid 130s to upper 57s  Exercising wome     Review of Systems No headache no chest pain some chronic back pain no abdominal pain no change in bowel habits    Objective:   Physical Exam  Alert vital stable blood pressure improved 136/88 on repeat. Lungs clear. Heart regular in rhythm.      Assessment & Plan:  Impression hypertension good control plan recheck in 6 months. Diet exercise discussed WSL

## 2015-03-21 ENCOUNTER — Other Ambulatory Visit: Payer: Self-pay | Admitting: Adult Health

## 2015-04-25 ENCOUNTER — Ambulatory Visit (INDEPENDENT_AMBULATORY_CARE_PROVIDER_SITE_OTHER): Payer: BLUE CROSS/BLUE SHIELD | Admitting: Family Medicine

## 2015-04-25 ENCOUNTER — Encounter: Payer: Self-pay | Admitting: Family Medicine

## 2015-04-25 VITALS — BP 170/94 | Ht 70.0 in | Wt 178.0 lb

## 2015-04-25 DIAGNOSIS — I471 Supraventricular tachycardia, unspecified: Secondary | ICD-10-CM

## 2015-04-25 DIAGNOSIS — R739 Hyperglycemia, unspecified: Secondary | ICD-10-CM | POA: Diagnosis not present

## 2015-04-25 DIAGNOSIS — N5201 Erectile dysfunction due to arterial insufficiency: Secondary | ICD-10-CM

## 2015-04-25 DIAGNOSIS — I10 Essential (primary) hypertension: Secondary | ICD-10-CM

## 2015-04-25 DIAGNOSIS — M7741 Metatarsalgia, right foot: Secondary | ICD-10-CM

## 2015-04-25 DIAGNOSIS — N529 Male erectile dysfunction, unspecified: Secondary | ICD-10-CM | POA: Insufficient documentation

## 2015-04-25 MED ORDER — LOSARTAN POTASSIUM 50 MG PO TABS: ORAL_TABLET | ORAL | Status: DC

## 2015-04-25 MED ORDER — METOPROLOL TARTRATE 25 MG PO TABS: 12.5000 mg | ORAL_TABLET | Freq: Two times a day (BID) | ORAL | Status: DC

## 2015-04-25 NOTE — Progress Notes (Signed)
   Subjective:    Patient ID: Leonard Armstrong, male    DOB: [birth year], 60 y.o.   MRN: [id number] Patient arrives office with numerous concerns Hypertension This is a chronic problem. The current episode started more than 1 year ago. There are no compliance problems.    Averaging 138 over 88, over six mo  Not exercising, doing   Just one half tab bid   Indocin taking for tright foot pain Felt to be gout, b w normal often. Had an x-ray through the podiatrist. Podiatrist finally with normal blood work advised patient he did not think he had true gout  Unfortunately continues to smoke occasional cough.  Utilizes Viagra for erectile dysfunction. No obvious side effects with it.   Patient has concerns of increase in blood pressure in the past 6 months. , Diastolic often over 90. Review of Systems No headache, no major weight loss or weight gain, no chest pain no back pain abdominal pain no change in bowel habits complete ROS otherwise negative  Blood pressure on repeat 152/88 Past alert vitals stable HEENT normal. Lungs clear. Heart regular in rhythm. Right foot some pain to deep palpation intermittent foot. No inflammation redness no swelling    Objective:   Physical Exam        Assessment & Plan:  Impression 1 hypertension suboptimal control discussed, time to add medications her current regimen #2 tachyarrhythmia clinically stable metoprolol No. 3 erectile dysfunction ongoing discussed #4 right foot metatarsalgia plan maintain Indocin when necessary. Add Cozaar. Diet exercise discussed. Encouraged to stop smoking follow-up for physical and couple months

## 2015-06-27 ENCOUNTER — Encounter: Payer: BLUE CROSS/BLUE SHIELD | Admitting: Family Medicine

## 2015-07-16 ENCOUNTER — Other Ambulatory Visit: Payer: Self-pay | Admitting: Family Medicine

## 2015-07-17 ENCOUNTER — Encounter: Payer: Self-pay | Admitting: Family Medicine

## 2015-07-17 ENCOUNTER — Ambulatory Visit (INDEPENDENT_AMBULATORY_CARE_PROVIDER_SITE_OTHER): Payer: BLUE CROSS/BLUE SHIELD | Admitting: Family Medicine

## 2015-07-17 VITALS — BP 148/88 | Ht 70.0 in | Wt 170.5 lb

## 2015-07-17 DIAGNOSIS — I10 Essential (primary) hypertension: Secondary | ICD-10-CM

## 2015-07-17 DIAGNOSIS — Z0000 Encounter for general adult medical examination without abnormal findings: Secondary | ICD-10-CM

## 2015-07-17 DIAGNOSIS — M542 Cervicalgia: Secondary | ICD-10-CM | POA: Diagnosis not present

## 2015-07-17 MED ORDER — METOPROLOL TARTRATE 25 MG PO TABS: 12.5000 mg | ORAL_TABLET | Freq: Two times a day (BID) | ORAL | Status: DC

## 2015-07-17 MED ORDER — LOSARTAN POTASSIUM 50 MG PO TABS: 50.0000 mg | ORAL_TABLET | Freq: Every day | ORAL | Status: DC

## 2015-07-17 MED ORDER — NABUMETONE 750 MG PO TABS: ORAL_TABLET | ORAL | Status: DC

## 2015-07-17 NOTE — Progress Notes (Signed)
Subjective:    Patient ID: Leonard Armstrong, male    DOB: [birth year], 60 y.o.   MRN: [id number]  HPI The patient comes in today for a wellness visit.  Having a flare up of neck pain, morning is the worse,  Rad to left shouder  And rad to right elbow  Pain is fairly severe at times  Pain is also accompaied by numbness and tingling, hurting not as much as night time   One and a half yr ago had a flare, ran its course  Takes two alweave per day ish  And takes tylenol daily in the eve    Pt had vibrating piece of equipment, was working on the project, developed severe pain  A review of their health history was completed.  A review of medications was also completed.  Any needed refills; Yes  Eating habits: Patient states eating habits are decent. Tries to incorporate healthy foods into diet.   Falls/  MVA accidents in past few months: None   Regular exercise: Patient states physically active at work. Moderate exercise outside of work. Walks after work.  Specialist pt sees on regular basis: None  Preventative health issues were discussed.   Additional concerns: Patient has concerns of neck pain.Onset 6 weeks ago.   +  Review of Systems  Constitutional: Negative for fever, activity change and appetite change.  HENT: Negative for congestion and rhinorrhea.   Eyes: Negative for discharge.  Respiratory: Negative for cough and wheezing.   Cardiovascular: Negative for chest pain.  Gastrointestinal: Negative for vomiting, abdominal pain and blood in stool.  Genitourinary: Negative for frequency and difficulty urinating.  Musculoskeletal: Negative for neck pain.  Skin: Negative for rash.  Allergic/Immunologic: Negative for environmental allergies and food allergies.  Neurological: Negative for weakness and headaches.  Psychiatric/Behavioral: Negative for agitation.  All other systems reviewed and are negative.      Objective:   Physical Exam  Constitutional: He appears  well-developed and well-nourished.  HENT:  Head: Normocephalic and atraumatic.  Right Ear: External ear normal.  Left Ear: External ear normal.  Nose: Nose normal.  Mouth/Throat: Oropharynx is clear and moist.  Eyes: EOM are normal. Pupils are equal, round, and reactive to light.  Neck: Normal range of motion. Neck supple. No thyromegaly present.  Cardiovascular: Normal rate, regular rhythm and normal heart sounds.   No murmur heard. Pulmonary/Chest: Effort normal and breath sounds normal. No respiratory distress. He has no wheezes.  Abdominal: Soft. Bowel sounds are normal. He exhibits no distension and no mass. There is no tenderness.  Genitourinary: Penis normal.  Musculoskeletal: Normal range of motion. He exhibits no edema.  Lymphadenopathy:    He has no cervical adenopathy.  Neurological: He is alert. He exhibits normal muscle tone.  Skin: Skin is warm and dry. No erythema.  Psychiatric: He has a normal mood and affect. His behavior is normal. Judgment normal.  Vitals reviewed. Prostate within normal limits next  Both arms symmetric strength sensation reflexes normal no shoulder pain bilateral positive lateral neck pain right greater than left positive pain with rotation neck        Assessment & Plan:  Impression 1 well adult exam up-to-date on colonoscopy #2 hypertension discuss suboptimum control patient reports the pain #3 bilateral cervical pain right greater than left with some neuropathic features but also with direct tenderness and bilateral which could represent paracervical strain plan appropriate blood work. Chronic medications refilled. Relafen prescribed for neck. If neck persists return for  further workup WSL

## 2015-08-23 ENCOUNTER — Telehealth: Payer: Self-pay | Admitting: Family Medicine

## 2015-08-23 NOTE — Telephone Encounter (Signed)
Spoke with patient and informed him per Dr.Steve Luking-actually advised to return for further work up if persists, now that the insurance  compainies are very resistant to covering scans have to see in follow up and spend whole visit documenting difficulties to press on with work up including a scan. Patent verbalized understanding and was transferred to front desk to schedule appointment.

## 2015-08-23 NOTE — Telephone Encounter (Signed)
Was put on a 30 day script for nabumetone (RELAFEN) 750 MG tablet  Pt states that he is now finished with this supply and is having the same pain come back again. Was told to call back once this was done and you would decide what to do from there.     [facility] [place]

## 2015-08-23 NOTE — Telephone Encounter (Signed)
Pt ws actually advised to return for further w u if persists, now that the insur compainies are very resistant to covdring scans have to see in f u and spend whole visit documenting difficulties to press on with work up including a scan

## 2015-08-28 ENCOUNTER — Encounter: Payer: Self-pay | Admitting: Family Medicine

## 2015-08-28 ENCOUNTER — Ambulatory Visit (INDEPENDENT_AMBULATORY_CARE_PROVIDER_SITE_OTHER): Payer: BLUE CROSS/BLUE SHIELD | Admitting: Family Medicine

## 2015-08-28 VITALS — BP 158/92 | Ht 70.0 in | Wt 174.6 lb

## 2015-08-28 DIAGNOSIS — M542 Cervicalgia: Secondary | ICD-10-CM | POA: Diagnosis not present

## 2015-08-28 DIAGNOSIS — G542 Cervical root disorders, not elsewhere classified: Secondary | ICD-10-CM

## 2015-08-28 MED ORDER — NABUMETONE 750 MG PO TABS: ORAL_TABLET | ORAL | 2 refills | Status: DC

## 2015-08-28 NOTE — Progress Notes (Signed)
   Subjective:    Patient ID: Leonard Armstrong, male    DOB: 1955/04/24, 60 y.o.   MRN: [id number]  HPI  Patient arrives for a follow up on back pain. Patient is currently out of Relafen and taking aleve. Patient states his back is doing fair  Relafen helped out, when the rel ran out   Shoulder and arm then started hurting again  Severe pain righ lat neck into right arm, all over the place  Went back on aleavePatient returns for major discussion. Please see prior notes. Has had another bout of severe pain.  Pain extended from right lateral neck down into right hand. Keep aching and burning. Had a feeling of weakness with it. Anti-inflammatory helped some but not quickly. Was very frustrated by. Noted substantial intense pain.  Wonders what his next choice is. Wonders if he needs to move on to MRI. Notes overall pain has now quieted down. No current weakness noticed by patient  Review of Systems No headache, no major weight loss or weight gain, no chest pain no back pain abdominal pain no change in bowel habits complete ROS otherwise negative     Objective:   Physical Exam  Alert vitals stable, NAD. Blood pressure good on repeat. HEENT normal. Lungs clear. Heart regular rate and rhythm. Right shoulder good range of motion no noticeable impingement strength intact distal arm strength sensation all intact .     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 Due to severity offered workup. Patient would like to try exercise is cotherapy and when necessary use of anti-inflammatories. If pain were to recur with weakness and not improved we will need to press on into an MRI of cervical spine spine discussed at great length. Many questions answered 25 minutes spent most in discussion

## 2015-08-29 ENCOUNTER — Encounter: Payer: Self-pay | Admitting: Family Medicine

## 2015-09-07 ENCOUNTER — Ambulatory Visit (HOSPITAL_COMMUNITY): Payer: BLUE CROSS/BLUE SHIELD | Attending: Family Medicine | Admitting: Physical Therapy

## 2015-09-07 DIAGNOSIS — M5412 Radiculopathy, cervical region: Secondary | ICD-10-CM

## 2015-09-07 NOTE — Patient Instructions (Addendum)
Copyright  VHI. All rights reserved.  AROM: Neck Extension    Bend head backward. Hold __1__ seconds. Repeat ___5_ times per set. Do __1__ sets per session. Do __3__ sessions per day.  http://orth.exer.us/300   Copyright  VHI. All rights reserved.  Flexibility: Neck Retraction    Pull head straight back, keeping eyes and jaw level. Repeat __5__ times per set. Do __1__ sets per session. Do ___3_ sessions per day.  http://orth.exer.us/344   Copyright  VHI. All rights reserved.  Scapular Retraction (Standing)    With arms at sides, pinch shoulder blades together. Repeat __10__ times per set. Do __1__ sets per session. Do _2___ sessions per day.  http://orth.exer.us/944   Copyright  VHI. All rights reserved.  Strengthening: Extension - Isometric (in Neutral)    Using light pressure from fingertips at back of head, resist bending head backward. Hold __5__ seconds.3 Repeat 5____ times per set. Do 1____ sets per session. Do ____ sessions per day.  http://orth.exer.us/308   CopyriCopyright  VHI. All rights reserved.  Progressive Resisted: External Rotation (Side-Lying)    Holding __2__ pound weight, towel under arm, raise right forearm toward ceiling. Keep elbow bent and at side. Repeat __10__ times per set. Do _1___ sets per session. Do _2___ sessions per day.  http://orth.exer.us/878   Copyright  VHI. All rights reserved.  Scapular Retraction (Prone)    Lie with arms at sides. Pinch shoulder blades together and raise arms a few inches from floor. Repeat _10___ times per set. Do _1___ sets per session. Do ___2_ sessions per day.  http://orth.exer.us/954   Copyright  VHI. All rights reserved.

## 2015-09-07 NOTE — Therapy (Signed)
[facility] [address] [place], [place], [address] Phone: [phone]   Fax:  [phone]  Physical Therapy Evaluation  Patient Details  Name: Leonard Armstrong MRN: [id number] Date of Birth: [birth year] Referring Provider: Baltazar Apo   Encounter Date: 09/07/2015      PT End of Session - 09/07/15 1503    Visit Number 1   Number of Visits 1   Authorization Type BCBS   Authorization - Visit Number 1   Authorization - Number of Visits 1   PT Start Time 1510   PT Stop Time 1555   PT Time Calculation (min) 45 min   Activity Tolerance Patient tolerated treatment well   Behavior During Therapy [facility] for tasks assessed/performed      Past Medical History:  Diagnosis Date  . Allergy   . Back pain   . Cancer (HCC)    Skin  . Hypertension   . Loss of smell   . Tachycardia     Past Surgical History:  Procedure Laterality Date  . BACK SURGERY    . HERNIA REPAIR    . VASECTOMY      There were no vitals filed for this visit.       Subjective Assessment - 09/07/15 1509    Subjective Leonard Armstrong states that he has had increased pain in his neck for the past 12 weeks.   His pain is mainly on his right side with occasional radiation into his hand approximately  once every other week.     Pertinent History HTN   Patient Stated Goals less pain    Currently in Pain? Yes   Pain Score 2   can go as high as an 8/10    Pain Location Neck   Pain Orientation Right   Pain Descriptors / Indicators Aching   Pain Type Chronic pain   Pain Radiating Towards to Rt thumb   Pain Onset More than a month ago   Pain Frequency Intermittent            [facility] PT Assessment - 09/07/15 0001      Assessment   Medical Diagnosis Cervical pain   Referring Provider Baltazar Apo    Onset Date/Surgical Date 05/07/15   Next MD Visit unscheduled   Prior Therapy none     Precautions   Precautions None     Restrictions   Weight Bearing Restrictions  No     Balance Screen   Has the patient fallen in the past 6 months No   Has the patient had a decrease in activity level because of a fear of falling?  No   Is the patient reluctant to leave their home because of a fear of falling?  No     Prior Function   Level of Independence Independent   Vocation Retired   Leisure hunt, fish, farming      Cognition   Overall Cognitive Status Within Functional Limits for tasks assessed     ROM / Strength   AROM / PROM / Strength AROM;Strength     AROM   AROM Assessment Site Shoulder;Cervical   Right/Left Shoulder Right;Left  WFL   Cervical Flexion 35  reps increase pain    Cervical Extension 55  reps no change    Cervical - Right Side Bend 40  no change with reps.   Cervical - Left Side Bend 45  reps no change    Cervical - Right Rotation 60  Cervical - Left Rotation 60     Strength   Strength Assessment Site Shoulder;Cervical   Right/Left Shoulder Right;Left   Right Shoulder Flexion 5/5   Right Shoulder Extension 4/5   Right Shoulder ABduction 5/5   Right Shoulder Internal Rotation 4/5   Right Shoulder External Rotation 4/5   Left Shoulder Flexion 5/5   Left Shoulder Extension 5/5   Left Shoulder ABduction 5/5   Left Shoulder Internal Rotation 5/5   Left Shoulder External Rotation 5/5   Cervical Extension 4/5   Cervical - Right Side Bend 5/5   Cervical - Left Side Bend 5/5     Palpation   Palpation comment spasms palpatable in bilateral trapezius musculature                    [facility] Adult PT Treatment/Exercise - 09/07/15 0001      Posture/Postural Control   Posture/Postural Control Postural limitations   Postural Limitations Forward head     Exercises   Exercises Neck     Neck Exercises: Seated   Cervical Isometrics Extension;3 secs;5 reps   Neck Retraction 5 reps;3 secs   Other Seated Exercise scapular retraction x 10    Other Seated Exercise cervical isometric for extension x 10     Neck  Exercises: Sidelying   Other Sidelying Exercise Rt shoulder ER with 2# x 10      Neck Exercises: Prone   Shoulder Extension 10 reps                PT Education - 09/07/15 1558    Education provided Yes   Education Details The importance of posture and HEP    Person(s) Educated Patient   Methods Explanation;Demonstration;Handout   Comprehension Verbalized understanding;Returned demonstration          PT Short Term Goals - 09/07/15 1559      PT SHORT TERM GOAL #1   Title Pt to understand the importance of proper posture in cervical care    Time 1   Period Days   Status New     PT SHORT TERM GOAL #2   Title Pt to be independent in a HEP in order to strengthen cervical muscular to decrease pain level to no more than a 3/10   Time 3   Period Weeks   Status New                  Plan - 09/07/15 1504    Clinical Impression Statement Leonard Armstrong is a 60 yo male who has had progressive cervical pain.  He has been referred to skilled physcial therapy for a one time treatment for a HEP.  Evaluation demonstrates decreased ROM , decreased strength, altered posture and increased pain.  Mr. Hockey will benefit from skilled physical therapy for a HEP to address these limitations.   If he does not have decreased pain and improved mobility in a week he may benefit from coming to therapy on a regular basis ie 2 or 3 times a week to progress him through a skilled program.    Rehab Potential Good   PT Frequency 1x / week   PT Duration --  1 week   PT Treatment/Interventions ADLs/Self Care Home Management;Patient/family education;Manual techniques;Therapeutic exercise   PT Next Visit Plan Pt MD has ordered a one time treatment only      Patient will benefit from skilled therapeutic intervention in order to improve the following deficits and impairments:  Decreased activity  tolerance, Decreased range of motion, Pain, Increased muscle spasms, Decreased strength  Visit  Diagnosis: Radiculopathy, cervical region     Problem List Patient Active Problem List   Diagnosis Date Noted  . Erectile dysfunction 04/25/2015  . Metatarsalgia of right foot 04/25/2015  . ETOH abuse 12/26/2013  . Sustained SVT ([place])   . AVNRT (AV nodal re-entry tachycardia) ([place])   . Tachycardia 12/25/2013  . Nicotine dependence 12/25/2013  . HTN (hypertension) 06/08/2012  . Allergic rhinitis 06/08/2012    Rayetta Humphrey, PT CLT [phone] 09/07/2015, 4:11 PM  [facility] [address] [place], [place], [address] Phone: [phone]   Fax:  [phone]  Name: Leonard Armstrong MRN: [id number] Date of Birth: Dec 02, 1955

## 2016-01-17 ENCOUNTER — Ambulatory Visit: Payer: BLUE CROSS/BLUE SHIELD | Admitting: Family Medicine

## 2016-01-20 ENCOUNTER — Other Ambulatory Visit: Payer: Self-pay | Admitting: Family Medicine

## 2016-01-23 ENCOUNTER — Ambulatory Visit: Payer: BLUE CROSS/BLUE SHIELD | Admitting: Family Medicine

## 2016-04-15 ENCOUNTER — Ambulatory Visit: Payer: BLUE CROSS/BLUE SHIELD | Admitting: Family Medicine

## 2016-04-20 ENCOUNTER — Other Ambulatory Visit: Payer: Self-pay | Admitting: Family Medicine

## 2016-04-21 ENCOUNTER — Ambulatory Visit: Payer: BLUE CROSS/BLUE SHIELD | Admitting: Family Medicine

## 2016-04-21 ENCOUNTER — Encounter: Payer: Self-pay | Admitting: Family Medicine

## 2016-04-25 ENCOUNTER — Ambulatory Visit (INDEPENDENT_AMBULATORY_CARE_PROVIDER_SITE_OTHER): Payer: BLUE CROSS/BLUE SHIELD | Admitting: Family Medicine

## 2016-04-25 ENCOUNTER — Encounter: Payer: Self-pay | Admitting: Family Medicine

## 2016-04-25 VITALS — BP 170/98 | Ht 70.0 in | Wt 173.0 lb

## 2016-04-25 DIAGNOSIS — Z79899 Other long term (current) drug therapy: Secondary | ICD-10-CM | POA: Diagnosis not present

## 2016-04-25 DIAGNOSIS — Z13220 Encounter for screening for lipoid disorders: Secondary | ICD-10-CM | POA: Diagnosis not present

## 2016-04-25 DIAGNOSIS — F5101 Primary insomnia: Secondary | ICD-10-CM

## 2016-04-25 DIAGNOSIS — M7541 Impingement syndrome of right shoulder: Secondary | ICD-10-CM

## 2016-04-25 DIAGNOSIS — I10 Essential (primary) hypertension: Secondary | ICD-10-CM

## 2016-04-25 DIAGNOSIS — Z125 Encounter for screening for malignant neoplasm of prostate: Secondary | ICD-10-CM

## 2016-04-25 MED ORDER — CLONAZEPAM 0.5 MG PO TABS: ORAL_TABLET | ORAL | 1 refills | Status: DC

## 2016-04-25 MED ORDER — LOSARTAN POTASSIUM 100 MG PO TABS: 100.0000 mg | ORAL_TABLET | Freq: Every day | ORAL | 1 refills | Status: DC

## 2016-04-25 MED ORDER — METOPROLOL TARTRATE 25 MG PO TABS: 12.5000 mg | ORAL_TABLET | Freq: Two times a day (BID) | ORAL | 1 refills | Status: DC

## 2016-04-25 NOTE — Progress Notes (Signed)
   Subjective:    Patient ID: Leonard Armstrong, male    DOB: 05/05/1955, 61 y.o.   MRN: [id number] Patient presents with numerous concerns. Hypertension  This is a chronic problem. The current episode started more than 1 year ago. There are no compliance problems.    Blood pressure medicine and blood pressure levels reviewed today with patient. Compliant with blood pressure medicine. States does not miss a dose. No obvious side effects. Blood pressure generally good when checked elsewhere. Watching salt intake.  BP overall not the best  PT overal has hlped the neck   140s over upper 80s    therapoist concrned re rotator cuff, ongoing pain with discomfort and pain. The neck, however, has improved considerablu Neck pain overall is improved. Shoulder pain appears to be worsening. Primarily right shoulder. Very painful with certain motions. Therapist felt that he likely had a rotator cuff injury  Unfortunately ongoing smoking. Has cut down tachypnea.  Patient compliant with insomnia medication. Generally takes most nights. No obvious morning drowsiness. Definitely helps patient sleep. Without it patient states would not get a good nights rest.   Does not miss a dose per mo  One ppd      Review of Systems No headache, no major weight loss or weight gain, no chest pain no back pain abdominal pain no change in bowel habits complete ROS otherwise negative     Objective:   Physical Exam Alert and oriented, vitals reviewed and stable, NAD ENT-TM's and ext canals WNL bilat via otoscopic exam Soft palate, tonsils and post pharynx WNL via oropharyngeal exam Neck-symmetric, no masses; thyroid nonpalpable and nontender Pulmonary-no tachypnea or accessory muscle use; Clear without wheezes via auscultation Card--no abnrml murmurs, rhythm reg and rate WNL Carotid pulses symmetric, without bruits Positive impingement sign right shoulder       Assessment & Plan:  Impression chronic  impingement right shoulder worsening long discussion held. Physical therapy really did not help. Recommend orthopedic referral discussed patient in agreement #2 hypertension good control discussed maintain same measurement 3 insomnia/anxiety ongoing. With need for occasional clonazepam. Patient would like to try. Prescription given. Appropriate blood work. Diet exercise discussed. Smoking cessation discussed. WSL

## 2016-05-01 ENCOUNTER — Encounter: Payer: Self-pay | Admitting: Family Medicine

## 2016-09-14 ENCOUNTER — Encounter (HOSPITAL_COMMUNITY): Payer: Self-pay | Admitting: Emergency Medicine

## 2016-09-14 ENCOUNTER — Emergency Department (HOSPITAL_COMMUNITY): Payer: BLUE CROSS/BLUE SHIELD

## 2016-09-14 ENCOUNTER — Emergency Department (HOSPITAL_COMMUNITY)
Admission: EM | Admit: 2016-09-14 | Discharge: 2016-09-14 | Disposition: A | Discharge status: Home / Self Care | Payer: BLUE CROSS/BLUE SHIELD | Attending: Emergency Medicine | Admitting: Emergency Medicine

## 2016-09-14 DIAGNOSIS — Z791 Long term (current) use of non-steroidal anti-inflammatories (NSAID): Secondary | ICD-10-CM | POA: Diagnosis not present

## 2016-09-14 DIAGNOSIS — F17210 Nicotine dependence, cigarettes, uncomplicated: Secondary | ICD-10-CM | POA: Diagnosis not present

## 2016-09-14 DIAGNOSIS — I10 Essential (primary) hypertension: Secondary | ICD-10-CM | POA: Diagnosis not present

## 2016-09-14 DIAGNOSIS — R079 Chest pain, unspecified: Secondary | ICD-10-CM | POA: Diagnosis present

## 2016-09-14 DIAGNOSIS — R000 Tachycardia, unspecified: Secondary | ICD-10-CM

## 2016-09-14 DIAGNOSIS — Z79899 Other long term (current) drug therapy: Secondary | ICD-10-CM | POA: Diagnosis not present

## 2016-09-14 LAB — CBC
HCT: 47.4 % (ref 39.0–52.0)
Hemoglobin: 16.3 g/dL (ref 13.0–17.0)
MCH: 34.2 pg — ABNORMAL HIGH (ref 26.0–34.0)
MCHC: 34.4 g/dL (ref 30.0–36.0)
MCV: 99.4 fL (ref 78.0–100.0)
PLATELETS: 284 10*3/uL (ref 150–400)
RBC: 4.77 MIL/uL (ref 4.22–5.81)
RDW: 12.5 % (ref 11.5–15.5)
WBC: 11.9 10*3/uL — AB (ref 4.0–10.5)

## 2016-09-14 LAB — BASIC METABOLIC PANEL
Anion gap: 9 (ref 5–15)
BUN: 11 mg/dL (ref 6–20)
CALCIUM: 9.8 mg/dL (ref 8.9–10.3)
CO2: 29 mmol/L (ref 22–32)
CREATININE: 0.73 mg/dL (ref 0.61–1.24)
Chloride: 99 mmol/L — ABNORMAL LOW (ref 101–111)
GFR calc non Af Amer: >60 mL/min (ref >60)
Glucose, Bld: 122 mg/dL — ABNORMAL HIGH (ref 65–99)
Potassium: 4.4 mmol/L (ref 3.5–5.1)
Sodium: 137 mmol/L (ref 135–145)

## 2016-09-14 LAB — MAGNESIUM: MAGNESIUM: 1.9 mg/dL (ref 1.7–2.4)

## 2016-09-14 LAB — URINALYSIS, ROUTINE W REFLEX MICROSCOPIC
BILIRUBIN URINE: NEGATIVE
Glucose, UA: NEGATIVE mg/dL
HGB URINE DIPSTICK: NEGATIVE
KETONES UR: NEGATIVE mg/dL
Leukocytes, UA: NEGATIVE
NITRITE: NEGATIVE
PH: 5 (ref 5.0–8.0)
Protein, ur: NEGATIVE mg/dL
SPECIFIC GRAVITY, URINE: 1.006 (ref 1.005–1.030)

## 2016-09-14 LAB — TROPONIN I: Troponin I: <0.03 ng/mL (ref <0.03)

## 2016-09-14 MED ORDER — ASPIRIN 81 MG PO CHEW
324.0000 mg | CHEWABLE_TABLET | Freq: Once | ORAL | Status: AC
  Administered 2016-09-14: 324 mg via ORAL
  Filled 2016-09-14: qty 4

## 2016-09-14 MED ORDER — METOPROLOL TARTRATE 5 MG/5ML IV SOLN
5.0000 mg | Freq: Once | INTRAVENOUS | Status: AC
  Administered 2016-09-14: 5 mg via INTRAVENOUS
  Filled 2016-09-14: qty 5

## 2016-09-14 NOTE — Discharge Instructions (Signed)
Your blood pressure and heart rate have responded nicely to additional beta blocker medication. Your chemistries are well within normal limits. Your heart enzyme is negative for acute problem. Please see Dr. Harl Bowie or member the cardiology team this week for follow-up. Please return to the emergency department immediately if the tachycardia issue returns.

## 2016-09-14 NOTE — ED Provider Notes (Signed)
[name]   CSN: [id number] Arrival date & time: 09/14/16  0850     History   Chief Complaint Chief Complaint  Patient presents with  . Chest Pain    HPI Leonard Armstrong is a 61 y.o. male.  Patient is a 61 year old male who presents to the emergency department with a complaint of fast heart rate and chest pain.  Patient states that approximately 8 AM he noticed not feeling well and checked his pulse and noticed that he had a tachycardia. Patient states he has a history of tachycardia dating back for about 4 years. The patient took his beta blocker medications around 7 AM. The patient called his son who is a paramedic who came over checked his vital signs are noted that his blood pressure was 170/100, and his heart rate was 180. The patient underwent a vagal maneuver and heart rate came down to 120. EMS was called. They brought the patient to the emergency department. The patient states that when his heart rate is above 120 has some pain in his chest. It seems to be more mostly diffuse, and seems to be related with the heart pounding in his chest. He states he has minimal shortness of breath during this time. He did not have any loss of consciousness with change in his general mental status during these episodes. The patient states he's been on beta blockers over the last 2 and half years. He presents now for evaluation and management of this issue. It is of note that the patient has been fighting a cold last 3 or 4 days. He has been taking Mucinex, but has not had any Mucinex D or other decongesting medications.   The history is provided by the patient.    Past Medical History:  Diagnosis Date  . Allergy   . Back pain   . Cancer (HCC)    Skin  . Hypertension   . Loss of smell   . Tachycardia     Patient Active Problem List   Diagnosis Date Noted  . Erectile dysfunction 04/25/2015  . Metatarsalgia of right foot 04/25/2015  . ETOH abuse 12/26/2013  .  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 AVNRT (AV nodal re-entry tachycardia) ([place])   . Tachycardia 12/25/2013  . Nicotine dependence 12/25/2013  . HTN (hypertension) 06/08/2012  . Allergic rhinitis 06/08/2012    Past Surgical History:  Procedure Laterality Date  . BACK SURGERY    . HERNIA REPAIR    . VASECTOMY         Home Medications    Prior to Admission medications   Medication Sig Start Date End Date Taking? Authorizing Provider  acetaminophen (TYLENOL) 500 MG tablet Take 1,000 mg by mouth every 6 (six) hours as needed for mild pain.    [provider]  clonazePAM Bobbye Charleston) 0.5 MG tablet Take one po qd prn anxiety 04/25/16   Mikey Kirschner, MD  indomethacin (INDOCIN) 50 MG capsule Reported on 07/17/2015 01/23/15   [provider]  losartan (COZAAR) 100 MG tablet Take 1 tablet (100 mg total) by mouth at bedtime. 04/25/16   Mikey Kirschner, MD  metoprolol tartrate (LOPRESSOR) 25 MG tablet Take 0.5 tablets (12.5 mg total) by mouth 2 (two) times daily. 04/25/16   Mikey Kirschner, MD  nabumetone (RELAFEN) 750 MG tablet Take 1 tablet by mouth twice a day with food. 08/28/15   Kathyrn Drown, MD  Naproxen Sodium (ALEVE) 220 MG CAPS Take by mouth.  [provider]  sildenafil (VIAGRA) 100 MG tablet Take 100 mg by mouth as needed for erectile dysfunction.    [provider]    Family History Family History  Problem Relation Age of Onset  . Lung cancer Mother   . Cancer Mother        lung  . Hypertension Father   . Cancer Father        father  . Heart disease Other   . Cancer Other        colon    Social History Social History  Substance Use Topics  . Smoking status: Current Every Day Smoker    Packs/day: 1.00    Types: Cigarettes    Start date: 01/20/1975  . Smokeless tobacco: Former Systems developer    Types: Chew  . Alcohol use 25.2 oz/week    42 Cans of beer per week     Allergies   Other; Prednisone; and Wasp venom   Review of  Systems Review of Systems  Constitutional: Negative for activity change.       All ROS Neg except as noted in HPI  HENT: Negative for nosebleeds.   Eyes: Negative for photophobia and discharge.  Respiratory: Negative for cough, shortness of breath and wheezing.   Cardiovascular: Positive for chest pain and palpitations. Negative for leg swelling.  Gastrointestinal: Negative for abdominal pain and blood in stool.  Genitourinary: Negative for dysuria, frequency and hematuria.  Musculoskeletal: Negative for arthralgias, back pain and neck pain.  Skin: Negative.   Neurological: Negative for dizziness, seizures and speech difficulty.  Psychiatric/Behavioral: Negative for confusion and hallucinations.     Physical Exam Updated Vital Signs BP (!) 172/105 (BP Location: Left Arm)   Pulse (!) 104   Temp 98.3 F (36.8 C) (Oral)   Resp 18   SpO2 96%   Physical Exam  Constitutional: He appears well-developed and well-nourished. No distress.  HENT:  Head: Normocephalic and atraumatic.  Right Ear: External ear normal.  Left Ear: External ear normal.  Eyes: Conjunctivae are normal. Right eye exhibits no discharge. Left eye exhibits no discharge. No scleral icterus.  Neck: Neck supple. Carotid bruit is not present. No tracheal deviation present.  Cardiovascular: Regular rhythm and intact distal pulses.  Tachycardia present.   Pulmonary/Chest: Effort normal and breath sounds normal. No stridor. No respiratory distress. He has no wheezes. He has no rales.  Abdominal: Soft. Bowel sounds are normal. He exhibits no distension. There is no tenderness. There is no rebound and no guarding.  Musculoskeletal: He exhibits no edema or tenderness.  Plan refill is less than 2 seconds.  Neurological: He is alert. He has normal strength. No cranial nerve deficit (no facial droop, extraocular movements intact, no slurred speech) or sensory deficit. He exhibits normal muscle tone. He displays no seizure  activity. Coordination normal.  Skin: Skin is warm and dry. No rash noted.  Psychiatric: He has a normal mood and affect.  Nursing note and vitals reviewed.    ED Treatments / Results  Labs (all labs ordered are listed, but only abnormal results are displayed) Labs Reviewed  CBC - Abnormal; Notable for the following:       Result Value   WBC 11.9 (*)    MCH 34.2 (*)    All other components within normal limits  BASIC METABOLIC PANEL  MAGNESIUM  TROPONIN I  URINALYSIS, ROUTINE W REFLEX MICROSCOPIC    EKG  EKG Interpretation  Date/Time:  Sunday September 14 2016 08:59:28 EDT  Ventricular Rate:  102 PR Interval:    QRS Duration: 79 QT Interval:  326 QTC Calculation: 425 R Axis:   83 Text Interpretation:  Sinus tachycardia Prolonged PR interval Probable anteroseptal infarct, old SINCE LAST TRACING HEART RATE HAS INCREASED Confirmed by Pattricia Boss [phone]) on 09/14/2016 9:02:58 AM       Radiology No results found.  Procedures Procedures (including critical care time) CRITICAL CARE Performed by: Lenox Ahr Total critical care time: **30* minutes Critical care time was exclusive of separately billable procedures and treating other patients. Critical care was necessary to treat or prevent imminent or life-threatening deterioration. Critical care was time spent personally by me on the following activities: development of treatment plan with patient and/or surrogate as well as nursing, discussions with consultants, evaluation of patient's response to treatment, examination of patient, obtaining history from patient or surrogate, ordering and performing treatments and interventions, ordering and review of laboratory studies, ordering and review of radiographic studies, pulse oximetry and re-evaluation of patient's condition. Medications Ordered in ED Medications  metoprolol tartrate (LOPRESSOR) injection 5 mg (not administered)  aspirin chewable tablet 324 mg (324 mg Oral  Given 09/14/16 8299)     Initial Impression / Assessment and Plan / ED Course  I have reviewed the triage vital signs and the nursing notes.  Pertinent labs & imaging results that were available during my care of the patient were reviewed by me and considered in my medical decision making (see chart for details).       Final Clinical Impressions(s) / ED Diagnoses MDM Patient has a tachycardia of 1 04/06/2008. Blood pressures elevated at 172/105.  Recheck. Systolic 371. Heart rate 94 to 104 Lopressor 5mg  given. No c/o chest pain. Pt awake and alert. Conversing with family without problem.  Urinalysis is well within normal limits. Basic metabolic panel is nonacute. The anion gap is 9. The complete blood count shows a white blood cells to be slightly elevated at 11.9, otherwise within normal limits. The magnesium level is 1.9, within normal limits. The troponin is less than 0.03, negative for acute event at this time. Chest x-ray shows some hyperextension involving the lungs consistent with chronic obstructive pulmonary disease. Is no evidence of pneumonia or pulmonary edema or acute issue. I discussed these findings with the patient in terms which he understands.   After Lopressor, the blood pressure is 696 systolic. Heart rate is 88 and sustained over observation period.  Feel that it is safe for the patient be discharged home. The patient will return to the emergency department immediately if any return of the tachycardia symptoms. The patient will follow-up with Dr. Harl Bowie and the cardiology team this week. Patient is in agreement with this plan.    Final diagnoses:  Tachycardia  Essential hypertension    New Prescriptions New Prescriptions   No medications on file     Lily Kocher, Hershal Coria 09/14/16 1109    Pattricia Boss, MD 09/15/16 2211

## 2016-09-14 NOTE — ED Triage Notes (Signed)
Pt reports episode of tachycardia starting at 0800 with chest pain.  Son arrived and got pt to perform vagal maneuvers and pt's HR decreased.  Pt denies pain currently.

## 2016-09-17 ENCOUNTER — Ambulatory Visit (INDEPENDENT_AMBULATORY_CARE_PROVIDER_SITE_OTHER): Payer: BLUE CROSS/BLUE SHIELD | Admitting: Cardiology

## 2016-09-17 ENCOUNTER — Encounter: Payer: Self-pay | Admitting: Cardiology

## 2016-09-17 VITALS — BP 157/89 | HR 94 | Ht 71.0 in | Wt 166.0 lb

## 2016-09-17 DIAGNOSIS — I10 Essential (primary) hypertension: Secondary | ICD-10-CM | POA: Diagnosis not present

## 2016-09-17 DIAGNOSIS — I471 Supraventricular tachycardia: Secondary | ICD-10-CM | POA: Diagnosis not present

## 2016-09-17 MED ORDER — AMLODIPINE BESYLATE 5 MG PO TABS: 5.0000 mg | ORAL_TABLET | Freq: Every day | ORAL | 1 refills | Status: DC

## 2016-09-17 NOTE — Patient Instructions (Signed)
Your physician recommends that you schedule a follow-up appointment in: 4 MONTHS WITH DR BRANCH  Your physician has recommended you make the following change in your medication:   START AMLODIPINE 5 MG DAILY   Thank you for choosing [facility]!!     

## 2016-09-17 NOTE — Progress Notes (Signed)
Clinical Summary Mr. Pasternak is a 61 y.o.male seen today for follow up of the following medical problems.   1.PSVT - prior admit 12/2013 with SVT (see 12/25/13 EKG). Counseled to decreased EtoH,started on beta blocker.  - ER visit 09/2016 with recurrent tachycardia. Notes mention heart rates up to 180, only avialble EKG shows sinus tach around 100 - first episode in quite some time. Recent chest and head cold. Has some caffeine at times - he reports prior higher doses of metoprolol caused fatigue.    2. HTN - home bp's 145s/85s  Past Medical History:  Diagnosis Date  . Allergy   . Back pain   . Cancer (HCC)    Skin  . Hypertension   . Loss of smell   . Tachycardia      Allergies  Allergen Reactions  . Other Other (See Comments)    Steroids- not in right state of mind. Makes vital signs erratic.   Marland Kitchen Prednisone Other (See Comments)    Not in right state of mind, makes vital signs erratic.   Marland Kitchen Wasp Venom Swelling     Current Outpatient Prescriptions  Medication Sig Dispense Refill  . acetaminophen (TYLENOL) 500 MG tablet Take 1,000 mg by mouth every 6 (six) hours as needed for mild pain.    . clonazePAM (KLONOPIN) 0.5 MG tablet Take one po qd prn anxiety 24 tablet 1  . Ibuprofen-Famotidine (DUEXIS) 800-26.6 MG TABS Take 1 tablet by mouth 2 (two) times daily.    Marland Kitchen losartan (COZAAR) 100 MG tablet Take 1 tablet (100 mg total) by mouth at bedtime. 90 tablet 1  . metoprolol tartrate (LOPRESSOR) 25 MG tablet Take 0.5 tablets (12.5 mg total) by mouth 2 (two) times daily. 90 tablet 1  . nabumetone (RELAFEN) 750 MG tablet Take 1 tablet by mouth twice a day with food. (Patient not taking: Reported on 09/14/2016) 60 tablet 2  . Naproxen Sodium (ALEVE) 220 MG CAPS Take by mouth.    . sildenafil (VIAGRA) 100 MG tablet Take 100 mg by mouth as needed for erectile dysfunction.     No current facility-administered medications for this visit.      Past Surgical History:  Procedure  Laterality Date  . BACK SURGERY    . HERNIA REPAIR    . VASECTOMY       Allergies  Allergen Reactions  . Other Other (See Comments)    Steroids- not in right state of mind. Makes vital signs erratic.   Marland Kitchen Prednisone Other (See Comments)    Not in right state of mind, makes vital signs erratic.   Marland Kitchen Wasp Venom Swelling      Family History  Problem Relation Age of Onset  . Lung cancer Mother   . Cancer Mother        lung  . Hypertension Father   . Cancer Father        father  . Heart disease Other   . Cancer Other        colon     Social History Mr. Schara reports that he has been smoking Cigarettes.  He started smoking about 41 years ago. He has been smoking about 1.00 pack per day. He has quit using smokeless tobacco. His smokeless tobacco use included Chew. Mr. Tankard reports that he drinks about 25.2 oz of alcohol per week .   Review of Systems CONSTITUTIONAL: No weight loss, fever, chills, weakness or fatigue.  HEENT: Eyes: No visual loss, blurred vision,  double vision or yellow sclerae.No hearing loss, sneezing, congestion, runny nose or sore throat.  SKIN: No rash or itching.  CARDIOVASCULAR: per hpi RESPIRATORY: per hpi GASTROINTESTINAL: No anorexia, nausea, vomiting or diarrhea. No abdominal pain or blood.  GENITOURINARY: No burning on urination, no polyuria NEUROLOGICAL: No headache, dizziness, syncope, paralysis, ataxia, numbness or tingling in the extremities. No change in bowel or bladder control.  MUSCULOSKELETAL: No muscle, back pain, joint pain or stiffness.  LYMPHATICS: No enlarged nodes. No history of splenectomy.  PSYCHIATRIC: No history of depression or anxiety.  ENDOCRINOLOGIC: No reports of sweating, cold or heat intolerance. No polyuria or polydipsia.  Marland Kitchen   Physical Examination Vitals:   09/17/16 0856  BP: (!) 157/89  Pulse: 94  SpO2: 94%   Vitals:   09/17/16 0856  Weight: 166 lb (75.3 kg)  Height: 5\' 11"  (1.803 m)    Gen: resting  comfortably, no acute distress HEENT: no scleral icterus, pupils equal round and reactive, no palptable cervical adenopathy,  CV: RRR, no m/r/g, no jvd Resp: Clear to auscultation bilaterally GI: abdomen is soft, non-tender, non-distended, normal bowel sounds, no hepatosplenomegaly MSK: extremities are warm, no edema.  Skin: warm, no rash Neuro:  no focal deficits Psych: appropriate affect   Diagnostic Studies 12/2013 echo Study Conclusions  - Left ventricle: The cavity size was normal. Wall thickness was normal. Systolic function was normal. The estimated ejection fraction was in the range of 60% to 65%. Wall motion was normal; there were no regional wall motion abnormalities. Left ventricular diastolic function parameters were normal. - Technically adequate study.  Jan 2016 MPI IMPRESSION: 1. Diaphragmatic attenuation noted without clear evidence of scar or significant ischemia.  2. Normal left ventricular wall motion.  3. Left ventricular ejection fraction 69%  4. Low-risk stress test findings*.    Assessment and Plan  1. PSVT - infrequent episodes, most recent being earlier this month - continue current medical therapy at this time. If more frequent would need to change to diltiazem, he did not tolerate high doses of lopressor. For refractory symptoms could consider ablation in the future  2. HTN - above goal, start norvasc 5mg  daily  F/u 4 months      Arnoldo Lenis, M.D.

## 2016-10-07 ENCOUNTER — Ambulatory Visit: Payer: BLUE CROSS/BLUE SHIELD | Admitting: Cardiology

## 2016-10-24 ENCOUNTER — Other Ambulatory Visit: Payer: Self-pay | Admitting: Family Medicine

## 2016-10-24 ENCOUNTER — Encounter: Payer: BLUE CROSS/BLUE SHIELD | Admitting: Family Medicine

## 2016-11-04 ENCOUNTER — Ambulatory Visit (INDEPENDENT_AMBULATORY_CARE_PROVIDER_SITE_OTHER): Payer: BLUE CROSS/BLUE SHIELD | Admitting: Family Medicine

## 2016-11-04 ENCOUNTER — Encounter: Payer: Self-pay | Admitting: Family Medicine

## 2016-11-04 VITALS — BP 174/90 | Ht 70.0 in | Wt 170.0 lb

## 2016-11-04 DIAGNOSIS — Z23 Encounter for immunization: Secondary | ICD-10-CM | POA: Diagnosis not present

## 2016-11-04 DIAGNOSIS — Z0000 Encounter for general adult medical examination without abnormal findings: Secondary | ICD-10-CM | POA: Diagnosis not present

## 2016-11-04 DIAGNOSIS — Z125 Encounter for screening for malignant neoplasm of prostate: Secondary | ICD-10-CM | POA: Diagnosis not present

## 2016-11-04 DIAGNOSIS — Z13220 Encounter for screening for lipoid disorders: Secondary | ICD-10-CM | POA: Diagnosis not present

## 2016-11-04 DIAGNOSIS — Z79899 Other long term (current) drug therapy: Secondary | ICD-10-CM

## 2016-11-04 DIAGNOSIS — I10 Essential (primary) hypertension: Secondary | ICD-10-CM

## 2016-11-04 MED ORDER — LOSARTAN POTASSIUM 100 MG PO TABS: 100.0000 mg | ORAL_TABLET | Freq: Every day | ORAL | 1 refills | Status: DC

## 2016-11-04 MED ORDER — AMLODIPINE BESYLATE 5 MG PO TABS: 5.0000 mg | ORAL_TABLET | Freq: Every day | ORAL | 1 refills | Status: DC

## 2016-11-04 MED ORDER — CLONAZEPAM 0.5 MG PO TABS: ORAL_TABLET | ORAL | 1 refills | Status: DC

## 2016-11-04 MED ORDER — METOPROLOL TARTRATE 25 MG PO TABS: ORAL_TABLET | ORAL | 1 refills | Status: DC

## 2016-11-04 NOTE — Progress Notes (Signed)
   Subjective:    Patient ID: Leonard Armstrong, male    DOB: February 24, 1955, 61 y.o.   MRN: [id number]  HPI The patient comes in today for a wellness visit.    A review of their health history was completed.  A review of medications was also completed.  Any needed refills; Yes  Eating habits: Good  Falls/  MVA accidents in past few months: None Regular exercise: No  Specialist pt sees on regular basis: Cardiologist [facility] [facility]. Dr Tonna Corner .  Preventative health issues were discussed.   Additional concerns:  Has had an attack of tachycardia   Also here today to follow up on HTN he is on Losartan 100 mg one QHS and metoprolol 25 mg 1/2 BID Norvasc 5 mg one daily in am.  Blood pressure medicine and blood pressure levels reviewed today with patient. Compliant with blood pressure medicine. States does not miss a dose. No obvious side effects. Blood pressure generally good when checked elsewhere. Watching salt intake.  Exercise not the best, mobility not as good as it was  Work not sure of duration of empoyment, pt retired but trying to get work going again   next colon due a age 79  clonazepam takes one rarely, not reg    Needs flu shot ,   Review of Systems  Constitutional: Negative for activity change, appetite change and fever.  HENT: Negative for congestion and rhinorrhea.   Eyes: Negative for discharge.  Respiratory: Negative for cough and wheezing.   Cardiovascular: Negative for chest pain.  Gastrointestinal: Negative for abdominal pain, blood in stool and vomiting.  Genitourinary: Negative for difficulty urinating and frequency.  Musculoskeletal: Negative for neck pain.  Skin: Negative for rash.  Allergic/Immunologic: Negative for environmental allergies and food allergies.  Neurological: Negative for weakness and headaches.  Psychiatric/Behavioral: Negative for agitation.  All other systems reviewed and are negative.      Objective:   Physical Exam    Constitutional: He appears well-developed and well-nourished.  HENT:  Head: Normocephalic and atraumatic.  Right Ear: External ear normal.  Left Ear: External ear normal.  Nose: Nose normal.  Mouth/Throat: Oropharynx is clear and moist.  Eyes: EOM are normal. Pupils are equal, round, and reactive to light.  Neck: Normal range of motion. Neck supple. No thyromegaly present.  Cardiovascular: Normal rate, regular rhythm and normal heart sounds.  No murmur heard. Pulmonary/Chest: Effort normal and breath sounds normal. No respiratory distress. He has no wheezes.  Abdominal: Soft. Bowel sounds are normal. He exhibits no distension and no mass. There is no tenderness.  Genitourinary: Penis normal.  Musculoskeletal: Normal range of motion. He exhibits no edema.  Lymphadenopathy:    He has no cervical adenopathy.  Neurological: He is alert. He exhibits normal muscle tone.  Skin: Skin is warm and dry. No erythema.  Psychiatric: He has a normal mood and affect. His behavior is normal. Judgment normal.  Vitals reviewed.         Assessment & Plan:  Impression 1 wellness exam.  Diet discussed.  Exercise discussed.  Smoking cessation discussed.  Patient once again did not get blood work.  This is been ordered multiple times.  Flu shot today.  Colonoscopy discussed.  Medications refilled.  Diet exercise discussed.

## 2017-01-23 ENCOUNTER — Ambulatory Visit: Payer: BLUE CROSS/BLUE SHIELD | Admitting: Cardiology

## 2017-03-03 ENCOUNTER — Encounter: Payer: Self-pay | Admitting: Cardiology

## 2017-03-03 ENCOUNTER — Other Ambulatory Visit: Payer: Self-pay

## 2017-03-03 ENCOUNTER — Ambulatory Visit: Payer: BLUE CROSS/BLUE SHIELD | Admitting: Cardiology

## 2017-03-03 VITALS — BP 161/80 | HR 91 | Ht 70.0 in | Wt 164.6 lb

## 2017-03-03 DIAGNOSIS — I471 Supraventricular tachycardia: Secondary | ICD-10-CM | POA: Diagnosis not present

## 2017-03-03 DIAGNOSIS — I10 Essential (primary) hypertension: Secondary | ICD-10-CM | POA: Diagnosis not present

## 2017-03-03 MED ORDER — DILTIAZEM HCL 60 MG PO TABS: 60.0000 mg | ORAL_TABLET | Freq: Two times a day (BID) | ORAL | 1 refills | Status: DC

## 2017-03-03 NOTE — Progress Notes (Signed)
Clinical Summary Leonard Armstrong is a 62 y.o.male  seen today for follow up of the following medical problems.   1.PSVT - prior admit 12/2013 with SVT (see 12/25/13 EKG). Counseled to decreased EtoH,started on beta blocker.  - ER visit 09/2016 with recurrent tachycardia. Notes mention heart rates up to 180, only avialble EKG shows sinus tach around 100 - first episode in quite some time. Recent chest and head cold. Has some caffeine at times - he reports prior higher doses of metoprolol caused fatigue.   - 2 episodes, lasted about 15 minutes. Took extra dose of metoprolol, did vagal maneuvers during episodes   2. HTN - home bp's around 140s/80s.   SH: retired Training and development officer   Past Medical History:  Diagnosis Date  . Allergy   . Back pain   . Cancer (HCC)    Skin  . Hypertension   . Loss of smell   . Tachycardia      Allergies  Allergen Reactions  . Other Other (See Comments)    Steroids- not in right state of mind. Makes vital signs erratic.   Marland Kitchen Prednisone Other (See Comments)    Not in right state of mind, makes vital signs erratic.   Marland Kitchen Wasp Venom Swelling     Current Outpatient Medications  Medication Sig Dispense Refill  . acetaminophen (TYLENOL) 500 MG tablet Take 1,000 mg by mouth every 6 (six) hours as needed for mild pain.    Marland Kitchen amLODipine (NORVASC) 5 MG tablet Take 1 tablet (5 mg total) by mouth daily. 90 tablet 1  . clonazePAM (KLONOPIN) 0.5 MG tablet Take one po qd prn anxiety 24 tablet 1  . Ibuprofen-Famotidine (DUEXIS) 800-26.6 MG TABS Take 1 tablet by mouth 2 (two) times daily.    Marland Kitchen losartan (COZAAR) 100 MG tablet Take 1 tablet (100 mg total) by mouth at bedtime. 90 tablet 1  . metoprolol tartrate (LOPRESSOR) 25 MG tablet TAKE 1/2 TABLET BY MOUTH TWO TIMES DAILY 90 tablet 1  . sildenafil (VIAGRA) 100 MG tablet Take 100 mg by mouth as needed for erectile dysfunction.     No current facility-administered medications for this visit.      Past  Surgical History:  Procedure Laterality Date  . BACK SURGERY    . HERNIA REPAIR    . VASECTOMY       Allergies  Allergen Reactions  . Other Other (See Comments)    Steroids- not in right state of mind. Makes vital signs erratic.   Marland Kitchen Prednisone Other (See Comments)    Not in right state of mind, makes vital signs erratic.   Marland Kitchen Wasp Venom Swelling      Family History  Problem Relation Age of Onset  . Lung cancer Mother   . Cancer Mother        lung  . Hypertension Father   . Cancer Father        father  . Heart disease Other   . Cancer Other        colon     Social History Mr. Loh reports that he has been smoking cigarettes.  He started smoking about 42 years ago. He has been smoking about 2.00 packs per day. He has quit using smokeless tobacco. His smokeless tobacco use included chew. Mr. Landress reports that he drinks about 25.2 oz of alcohol per week.   Review of Systems CONSTITUTIONAL: No weight loss, fever, chills, weakness or fatigue.  HEENT: Eyes: No  visual loss, blurred vision, double vision or yellow sclerae.No hearing loss, sneezing, congestion, runny nose or sore throat.  SKIN: No rash or itching.  CARDIOVASCULAR: per hpi RESPIRATORY: No shortness of breath, cough or sputum.  GASTROINTESTINAL: No anorexia, nausea, vomiting or diarrhea. No abdominal pain or blood.  GENITOURINARY: No burning on urination, no polyuria NEUROLOGICAL: No headache, dizziness, syncope, paralysis, ataxia, numbness or tingling in the extremities. No change in bowel or bladder control.  MUSCULOSKELETAL: No muscle, back pain, joint pain or stiffness.  LYMPHATICS: No enlarged nodes. No history of splenectomy.  PSYCHIATRIC: No history of depression or anxiety.  ENDOCRINOLOGIC: No reports of sweating, cold or heat intolerance. No polyuria or polydipsia.  Marland Kitchen   Physical Examination Vitals:   03/03/17 1131  BP: (!) 161/80  Pulse: 91   Vitals:   03/03/17 1131  Weight: 164 lb 9.6 oz  (74.7 kg)  Height: 5\' 10"  (1.778 m)    Gen: resting comfortably, no acute distress HEENT: no scleral icterus, pupils equal round and reactive, no palptable cervical adenopathy,  CV: RRR, no m/r/g, no jvd Resp: Clear to auscultation bilaterally GI: abdomen is soft, non-tender, non-distended, normal bowel sounds, no hepatosplenomegaly MSK: extremities are warm, no edema.  Skin: warm, no rash Neuro:  no focal deficits Psych: appropriate affect   Diagnostic Studies  12/2013 echo Study Conclusions  - Left ventricle: The cavity size was normal. Wall thickness was normal. Systolic function was normal. The estimated ejection fraction was in the range of 60% to 65%. Wall motion was normal; there were no regional wall motion abnormalities. Left ventricular diastolic function parameters were normal. - Technically adequate study.  Jan 2016 MPI IMPRESSION: 1. Diaphragmatic attenuation noted without clear evidence of scar or significant ischemia.  2. Normal left ventricular wall motion.  3. Left ventricular ejection fraction 69%  4. Low-risk stress test findings*.    Assessment and Plan  1. PSVT - some recent episodes - has not tolerated higher beta blocker doses due to fatigue. - we will d/c lopressor, start dilt 60mg  bid  2. HTN - above goal. Since starting dilt we will d/c norvasc - follow bp's  F/u 6 months      Arnoldo Lenis, M.D.

## 2017-03-03 NOTE — Patient Instructions (Signed)
Your physician wants you to follow-up in: [place] will receive a reminder letter in the mail two months in advance. If you don't receive a letter, please call our office to schedule the follow-up appointment.  Your physician has recommended you make the following change in your medication:   STOP AMLODIPINE   STOP METOPROLOL   START DILTIAZEM 60 MG TWICE DAILY  Thank you for choosing [facility]!!

## 2017-03-06 ENCOUNTER — Encounter: Payer: Self-pay | Admitting: Cardiology

## 2017-03-16 ENCOUNTER — Other Ambulatory Visit: Payer: Self-pay | Admitting: Family Medicine

## 2017-03-16 NOTE — Telephone Encounter (Signed)
No per dr branch started on diltiazem which is a close cousin to norvasc, he said in note stopping nor and starting dilt, so should not be taken together

## 2017-05-04 ENCOUNTER — Ambulatory Visit: Payer: BLUE CROSS/BLUE SHIELD | Admitting: Family Medicine

## 2017-07-22 ENCOUNTER — Other Ambulatory Visit: Payer: Self-pay | Admitting: Cardiology

## 2017-07-26 ENCOUNTER — Other Ambulatory Visit: Payer: Self-pay | Admitting: Family Medicine

## 2017-08-20 ENCOUNTER — Ambulatory Visit: Payer: BLUE CROSS/BLUE SHIELD | Admitting: Cardiology

## 2017-08-20 ENCOUNTER — Other Ambulatory Visit: Payer: Self-pay

## 2017-08-20 ENCOUNTER — Encounter: Payer: Self-pay | Admitting: Cardiology

## 2017-08-20 VITALS — BP 161/107 | HR 193 | Ht 72.0 in | Wt 167.0 lb

## 2017-08-20 DIAGNOSIS — I471 Supraventricular tachycardia: Secondary | ICD-10-CM

## 2017-08-20 DIAGNOSIS — R002 Palpitations: Secondary | ICD-10-CM

## 2017-08-20 DIAGNOSIS — I10 Essential (primary) hypertension: Secondary | ICD-10-CM | POA: Diagnosis not present

## 2017-08-20 MED ORDER — DILTIAZEM HCL 60 MG PO TABS: 90.0000 mg | ORAL_TABLET | Freq: Two times a day (BID) | ORAL | 1 refills | Status: DC

## 2017-08-20 MED ORDER — LOSARTAN POTASSIUM 100 MG PO TABS: ORAL_TABLET | ORAL | 1 refills | Status: DC

## 2017-08-20 NOTE — Progress Notes (Signed)
Clinical Summary Mr. Eble is a 62 y.o.male seen today for follow up of the following medical problems.  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 Counseled to decreased EtoH,started on beta blocker.  - ER visit 09/2016 with recurrent tachycardia. Notes mention heart rates up to 180, only avialble EKG shows sinus tach around 100 - first episode in quite some time. Recent chest and head cold. Has some caffeine at times - he reports prior higher doses of metoprolol caused fatigue.  - 2 episodes, lasted about 15 minutes. Took extra dose of metoprolol, did vagal maneuvers during episodes   - last visit changed to diltiazem due to ongoing symptoms and side effects on higher dose beta blocker in the past.   - did very well on dilt initially - episode  on Saturday of palpitations less 10 minutes. This morning episode, feeling of heart racing and mild chest pain this morning.  - recent EtOH use    2. HTN - home bps around 140s/80s. Chronic shoulder pain.     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   Past Medical History:  Diagnosis Date  . Allergy   . Back pain   . Cancer (HCC)    Skin  . Hypertension   . Loss of smell   . Tachycardia      Allergies  Allergen Reactions  . Other Other (See Comments)    Steroids- not in right state of mind. Makes vital signs erratic.   Marland Kitchen Prednisone Other (See Comments)    Not in right state of mind, makes vital signs erratic.   Marland Kitchen Wasp Venom Swelling     Current Outpatient Medications  Medication Sig Dispense Refill  . acetaminophen (TYLENOL) 500 MG tablet Take 1,000 mg by mouth every 6 (six) hours as needed for mild pain.    . clonazePAM (KLONOPIN) 0.5 MG tablet Take one po qd prn anxiety 24 tablet 1  . diltiazem (CARDIZEM) 60 MG tablet TAKE 1 TABLET (60 MG TOTAL) BY MOUTH 2 (TWO) TIMES DAILY. 180 tablet 2  . Ibuprofen-Famotidine (DUEXIS) 800-26.6 MG TABS Take 1 tablet by mouth 2  (two) times daily.    Marland Kitchen losartan (COZAAR) 100 MG tablet TAKE 1 TABLET BY MOUTH EVERYDAY AT BEDTIME 30 tablet 0  . sildenafil (VIAGRA) 100 MG tablet Take 100 mg by mouth as needed for erectile dysfunction.     No current facility-administered medications for this visit.      Past Surgical History:  Procedure Laterality Date  . BACK SURGERY    . HERNIA REPAIR    . VASECTOMY       Allergies  Allergen Reactions  . Other Other (See Comments)    Steroids- not in right state of mind. Makes vital signs erratic.   Marland Kitchen Prednisone Other (See Comments)    Not in right state of mind, makes vital signs erratic.   Marland Kitchen Wasp Venom Swelling      Family History  Problem Relation Age of Onset  . Lung cancer Mother   . Cancer Mother        lung  . Hypertension Father   . Cancer Father        father  . Heart disease Other   . Cancer Other        colon     Social History Mr. Rommel reports that he has been smoking cigarettes. He started smoking about 42 years ago. He has been smoking about  2.00 packs per day. He has quit using smokeless tobacco.  His smokeless tobacco use included chew. Mr. Knaggs reports that he drinks about 42.0 standard drinks of alcohol per week.   Review of Systems CONSTITUTIONAL: No weight loss, fever, chills, weakness or fatigue.  HEENT: Eyes: No visual loss, blurred vision, double vision or yellow sclerae.No hearing loss, sneezing, congestion, runny nose or sore throat.  SKIN: No rash or itching.  CARDIOVASCULAR: palpitations.  RESPIRATORY: No shortness of breath, cough or sputum.  GASTROINTESTINAL: No anorexia, nausea, vomiting or diarrhea. No abdominal pain or blood.  GENITOURINARY: No burning on urination, no polyuria NEUROLOGICAL: No headache, dizziness, syncope, paralysis, ataxia, numbness or tingling in the extremities. No change in bowel or bladder control.  MUSCULOSKELETAL: No muscle, back pain, joint pain or stiffness.  LYMPHATICS: No enlarged nodes. No  history of splenectomy.  PSYCHIATRIC: No history of depression or anxiety.  ENDOCRINOLOGIC: No reports of sweating, cold or heat intolerance. No polyuria or polydipsia.  Marland Kitchen   Physical Examination Vitals:   08/20/17 0822  BP: (!) 161/107  Pulse: (!) 193  SpO2: 99%   Vitals:   08/20/17 0822  Weight: 167 lb (75.8 kg)  Height: 6' (1.829 m)    Gen: resting comfortably, no acute distress HEENT: no scleral icterus, pupils equal round and reactive, no palptable cervical adenopathy,  CV: Regular, tachy120, no m/r/g, no jvd Resp: Clear to auscultation bilaterally GI: abdomen is soft, non-tender, non-distended, normal bowel sounds, no hepatosplenomegaly MSK: extremities are warm, no edema.  Skin: warm, no rash Neuro:  no focal deficits Psych: appropriate affect   Diagnostic Studies 12/2013 echo Study Conclusions  - Left ventricle: The cavity size was normal. Wall thickness was normal. Systolic function was normal. The estimated ejection fraction was in the range of 60% to 65%. Wall motion was normal; there were no regional wall motion abnormalities. Left ventricular diastolic function parameters were normal. - Technically adequate study.  Jan 2016 MPI IMPRESSION: 1. Diaphragmatic attenuation noted without clear evidence of scar or significant ischemia.  2. Normal left ventricular wall motion.  3. Left ventricular ejection fraction 69%  4. Low-risk stress test findings*.     Assessment and Plan  1. PSVT - initially did well on dilt, recent episodes over the last few days includnig this morning. EKG in clinic today shows SVT to 188 and patient was symptomatic.  - vagal maneuvers performed wit patient in clinic, rhythm broke to sinus tach 110 by repeat EKG. Patient has extra dose of 60mg  dilt with him and will take at this time.  - increase dilt to 90mg  bid, ok to take additional dose as needed for palpitations.   2. HTN - follow bp with increase in dilt.      F/u 4 months     Arnoldo Lenis, M.D.

## 2017-08-20 NOTE — Patient Instructions (Signed)
Your physician recommends that you schedule a follow-up appointment in: [place] has recommended you make the following change in your medication:   INCREASE DILTIAZEM 90MG  (1.5 TABLETS) TWICE DAILY  Thank you for choosing [facility]!!

## 2017-12-28 ENCOUNTER — Ambulatory Visit: Payer: BLUE CROSS/BLUE SHIELD | Admitting: Cardiology

## 2018-01-21 ENCOUNTER — Encounter: Payer: Self-pay | Admitting: Cardiology

## 2018-01-21 ENCOUNTER — Ambulatory Visit: Payer: BLUE CROSS/BLUE SHIELD | Admitting: Cardiology

## 2018-01-21 VITALS — BP 172/84 | HR 78 | Ht 72.0 in | Wt 172.6 lb

## 2018-01-21 DIAGNOSIS — I471 Supraventricular tachycardia: Secondary | ICD-10-CM | POA: Diagnosis not present

## 2018-01-21 DIAGNOSIS — I10 Essential (primary) hypertension: Secondary | ICD-10-CM | POA: Diagnosis not present

## 2018-01-21 MED ORDER — CHLORTHALIDONE 25 MG PO TABS: 12.5000 mg | ORAL_TABLET | Freq: Every day | ORAL | 1 refills | Status: DC

## 2018-01-21 NOTE — Patient Instructions (Signed)
Your physician wants you to follow-up in: [place] will receive a reminder letter in the mail two months in advance. If you don't receive a letter, please call our office to schedule the follow-up appointment.  Your physician has recommended you make the following change in your medication:   START CHLORTHALIDONE 12.5 MG (1/2 TABLET) DAILY   Your physician recommends that you return for lab work in: [place] BMP/MG  Your physician has requested that you regularly monitor and record your blood pressure readings at home FOR 2 [place]   Thank you for choosing [facility]!!

## 2018-01-21 NOTE — Progress Notes (Signed)
Clinical Summary Mr. Delair is a 63 y.o.male seen today for follow up of the following medical problems.  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 Counseled to decreased EtoH,started on beta blocker.  - ER visit 09/2016 with recurrent tachycardia. Notes mention heart rates up to 180, only avialble EKG shows sinus tach around 100 - first episode in quite some time. Recent chest and head cold. Has some caffeine at times - he reports prior higher doses of metoprolol caused fatigue.   - last visit increased dilt to 90mg  bid after reported symptoms, actually had an episode of SVT in the office that broke with vagal maneuvers.  - since med increase no significant symptoms, 2 short lived episodes of palpitations.   2. 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    Past Medical History:  Diagnosis Date  . Allergy   . Back pain   . Cancer (HCC)    Skin  . Hypertension   . Loss of smell   . Tachycardia      Allergies  Allergen Reactions  . Other Other (See Comments)    Steroids- not in right state of mind. Makes vital signs erratic.   Marland Kitchen Prednisone Other (See Comments)    Not in right state of mind, makes vital signs erratic.   Marland Kitchen Wasp Venom Swelling     Current Outpatient Medications  Medication Sig Dispense Refill  . acetaminophen (TYLENOL) 500 MG tablet Take 1,000 mg by mouth every 6 (six) hours as needed for mild pain.    . clonazePAM (KLONOPIN) 0.5 MG tablet Take one po qd prn anxiety 24 tablet 1  . diltiazem (CARDIZEM) 60 MG tablet Take 1.5 tablets (90 mg total) by mouth 2 (two) times daily. 270 tablet 1  . losartan (COZAAR) 100 MG tablet TAKE 1 TABLET BY MOUTH EVERYDAY AT BEDTIME 90 tablet 1  . sildenafil (VIAGRA) 100 MG tablet Take 100 mg by mouth as needed for erectile dysfunction.     No current facility-administered medications for this visit.      Past Surgical History:  Procedure  Laterality Date  . BACK SURGERY    . HERNIA REPAIR    . VASECTOMY       Allergies  Allergen Reactions  . Other Other (See Comments)    Steroids- not in right state of mind. Makes vital signs erratic.   Marland Kitchen Prednisone Other (See Comments)    Not in right state of mind, makes vital signs erratic.   Marland Kitchen Wasp Venom Swelling      Family History  Problem Relation Age of Onset  . Lung cancer Mother   . Cancer Mother        lung  . Hypertension Father   . Cancer Father        father  . Heart disease Other   . Cancer Other        colon     Social History Mr. Catala reports that he has been smoking cigarettes. He started smoking about 43 years ago. He has been smoking about 2.00 packs per day. He has quit using smokeless tobacco.  His smokeless tobacco use included chew. Mr. Bolger reports current alcohol use of about 42.0 standard drinks of alcohol per week.   Review of Systems CONSTITUTIONAL: No weight loss, fever, chills, weakness or fatigue.  HEENT: Eyes: No visual loss, blurred vision, double vision or yellow sclerae.No hearing loss, sneezing,  congestion, runny nose or sore throat.  SKIN: No rash or itching.  CARDIOVASCULAR: per hpi RESPIRATORY: No shortness of breath, cough or sputum.  GASTROINTESTINAL: No anorexia, nausea, vomiting or diarrhea. No abdominal pain or blood.  GENITOURINARY: No burning on urination, no polyuria NEUROLOGICAL: No headache, dizziness, syncope, paralysis, ataxia, numbness or tingling in the extremities. No change in bowel or bladder control.  MUSCULOSKELETAL: No muscle, back pain, joint pain or stiffness.  LYMPHATICS: No enlarged nodes. No history of splenectomy.  PSYCHIATRIC: No history of depression or anxiety.  ENDOCRINOLOGIC: No reports of sweating, cold or heat intolerance. No polyuria or polydipsia.  Marland Kitchen   Physical Examination Vitals:   01/21/18 1321  BP: (!) 172/84  Pulse: 78  SpO2: 99%   Vitals:   01/21/18 1321  Weight: 172 lb 9.6 oz  (78.3 kg)  Height: 6' (1.829 m)    Gen: resting comfortably, no acute distress HEENT: no scleral icterus, pupils equal round and reactive, no palptable cervical adenopathy,  CV: RRR, no m/r/g, no jvd Resp: Clear to auscultation bilaterally GI: abdomen is soft, non-tender, non-distended, normal bowel sounds, no hepatosplenomegaly MSK: extremities are warm, no edema.  Skin: warm, no rash Neuro:  no focal deficits Psych: appropriate affect   Diagnostic Studies 12/2013 echo Study Conclusions  - Left ventricle: The cavity size was normal. Wall thickness was normal. Systolic function was normal. The estimated ejection fraction was in the range of 60% to 65%. Wall motion was normal; there were no regional wall motion abnormalities. Left ventricular diastolic function parameters were normal. - Technically adequate study.  Jan 2016 MPI IMPRESSION: 1. Diaphragmatic attenuation noted without clear evidence of scar or significant ischemia.  2. Normal left ventricular wall motion.  3. Left ventricular ejection fraction 69%  4. Low-risk stress test findings*.     Assessment and Plan  1. PSVT -doing well on higher dilt dosing, continue current therapy - room to titrate dilt if needed, side effects on lopressor prevously. If ever refractory symptoms would refer to EP to consider ablation  2. HTN - above goal, start chlorthalidone 12.5mg  daily - in 2 weeks check BMET/Mg, submit bp log   F/u 6 months       Arnoldo Lenis, M.D.

## 2018-03-09 ENCOUNTER — Other Ambulatory Visit: Payer: Self-pay | Admitting: *Deleted

## 2018-03-09 MED ORDER — LOSARTAN POTASSIUM 100 MG PO TABS: ORAL_TABLET | ORAL | 2 refills | Status: DC

## 2018-03-12 ENCOUNTER — Telehealth: Payer: Self-pay | Admitting: Cardiology

## 2018-03-12 ENCOUNTER — Other Ambulatory Visit: Payer: Self-pay | Admitting: Cardiology

## 2018-03-12 MED ORDER — LOSARTAN POTASSIUM 100 MG PO TABS
ORAL_TABLET | ORAL | 2 refills | Status: DC
Start: 2018-03-12 — End: 2018-12-06

## 2018-03-12 NOTE — Telephone Encounter (Signed)
Sent refill to [facility] as [facility] is out. Pt notified and is ok with switch.

## 2018-03-12 NOTE — Telephone Encounter (Signed)
losartan (COZAAR) 100 MG tablet   Patient walk in   [facility] told him they are out of this medication and he thinks may need prior authorization

## 2018-03-18 ENCOUNTER — Telehealth: Payer: Self-pay | Admitting: *Deleted

## 2018-03-18 DIAGNOSIS — I10 Essential (primary) hypertension: Secondary | ICD-10-CM

## 2018-03-18 MED ORDER — SPIRONOLACTONE 25 MG PO TABS: 12.5000 mg | ORAL_TABLET | Freq: Every day | ORAL | 1 refills | Status: DC

## 2018-03-18 NOTE — Telephone Encounter (Signed)
Pt voiced understanding - Aldactone sent to [facility] as requested - pt will come by office to pick up lab orders - placed up front

## 2018-03-18 NOTE — Telephone Encounter (Signed)
-----   Message from Arnoldo Lenis, MD sent at 03/18/2018  2:34 PM EDT ----- BP's improving but still too high. Please start aldactone 12.5mg  daily, needs BMET in 2weeks  Zandra Abts MD

## 2018-03-18 NOTE — Telephone Encounter (Signed)
LM to return call.

## 2018-03-20 ENCOUNTER — Other Ambulatory Visit: Payer: Self-pay | Admitting: Cardiology

## 2018-03-23 ENCOUNTER — Telehealth: Payer: Self-pay | Admitting: *Deleted

## 2018-03-23 NOTE — Telephone Encounter (Signed)
Pt voiced understanding - routed to pcp  

## 2018-03-23 NOTE — Telephone Encounter (Signed)
-----   Message from Arnoldo Lenis, MD sent at 03/19/2018 11:41 AM EDT ----- Labs look fine. Needs to be sure to stay well hydrated with his blood pressure pills, drink plenty of water   J BrancH MD

## 2018-07-18 ENCOUNTER — Other Ambulatory Visit: Payer: Self-pay | Admitting: Cardiology

## 2018-08-12 ENCOUNTER — Telehealth: Payer: Self-pay | Admitting: *Deleted

## 2018-08-12 NOTE — Telephone Encounter (Signed)
-----   Message from Arnoldo Lenis, MD sent at 08/12/2018  3:32 PM EDT ----- Sodium is a little low, likely from one of his bp meds. We will discuss at our upcoming appt in the next few days if need to make any changes   Zandra Abts MD

## 2018-08-12 NOTE — Telephone Encounter (Signed)
Pt voiced understanding - routed to pcp  

## 2018-08-16 ENCOUNTER — Ambulatory Visit: Payer: BLUE CROSS/BLUE SHIELD | Admitting: Cardiology

## 2018-10-04 ENCOUNTER — Other Ambulatory Visit: Payer: Self-pay | Admitting: *Deleted

## 2018-10-04 MED ORDER — DILTIAZEM HCL 60 MG PO TABS: ORAL_TABLET | ORAL | 1 refills | Status: DC

## 2018-11-04 ENCOUNTER — Telehealth: Payer: Self-pay | Admitting: Cardiology

## 2018-11-04 NOTE — Telephone Encounter (Signed)
Virtual Visit Pre-Appointment Phone Call  "(Name), I am calling you today to discuss your upcoming appointment. We are currently trying to limit exposure to the virus that causes COVID-19 by seeing patients at home rather than in the office."  1. "What is the BEST phone number to call the day of the visit?" - [phone] 2.   3. Do you have or have access to (through a family member/friend) a smartphone with video capability that we can use for your visit?" a. If yes - list this number in appt notes as cell (if different from BEST phone #) and list the appointment type as a VIDEO visit in appointment notes b. If no - list the appointment type as a PHONE visit in appointment notes  4. Confirm consent - "In the setting of the current Covid19 crisis, you are scheduled for a (phone or video) visit with your provider on (date) at (time).  Just as we do with many in-office visits, in order for you to participate in this visit, we must obtain consent.  If you'd like, I can send this to your mychart (if signed up) or email for you to review.  Otherwise, I can obtain your verbal consent now.  All virtual visits are billed to your insurance company just like a normal visit would be.  By agreeing to a virtual visit, we'd like you to understand that the technology does not allow for your provider to perform an examination, and thus may limit your provider's ability to fully assess your condition. If your provider identifies any concerns that need to be evaluated in person, we will make arrangements to do so.  Finally, though the technology is pretty good, we cannot assure that it will always work on either your or our end, and in the setting of a video visit, we may have to convert it to a phone-only visit.  In either situation, we cannot ensure that we have a secure connection.  Are you willing to proceed?" STAFF: Did the patient verbally acknowledge consent to telehealth visit? Document YES/NO here: YES     5. Advise patient to be prepared - "Two hours prior to your appointment, go ahead and check your blood pressure, pulse, oxygen saturation, and your weight (if you have the equipment to check those) and write them all down. When your visit starts, your provider will ask you for this information. If you have an Apple Watch or Kardia device, please plan to have heart rate information ready on the day of your appointment. Please have a pen and paper handy nearby the day of the visit as well."  6. Give patient instructions for MyChart download to smartphone OR [email or web address] as below if video visit (depending on what platform provider is using)  7. Inform patient they will receive a phone call 15 minutes prior to their appointment time (may be from unknown caller ID) so they should be prepared to answer    TELEPHONE CALL NOTE  Leonard Armstrong has been deemed a candidate for a follow-up tele-health visit to limit community exposure during the Covid-19 pandemic. I spoke with the patient via phone to ensure availability of phone/video source, confirm preferred email & phone number, and discuss instructions and expectations.  I reminded Leonard Armstrong to be prepared with any vital sign and/or heart rhythm information that could potentially be obtained via home monitoring, at the time of his visit. I reminded Leonard Armstrong to expect a phone call prior to  his visit.  Leonard Armstrong 11/04/2018 3:19 PM   INSTRUCTIONS FOR DOWNLOADING THE MYCHART APP TO SMARTPHONE  - The patient must first make sure to have activated MyChart and know their login information - If Apple, go to [employer] and type in MyChart in the search bar and download the app. If Android, ask patient to go to [employer] and type in Elsa in the search bar and download the app. The app is free but as with any other app downloads, their phone may require them to verify saved payment information or Apple/Android password.  - The  patient will need to then log into the app with their MyChart username and password, and select [facility] as their healthcare provider to link the account. When it is time for your visit, go to the MyChart app, find appointments, and click Begin Video Visit. Be sure to Select Allow for your device to access the Microphone and Camera for your visit. You will then be connected, and your provider will be with you shortly.  **If they have any issues connecting, or need assistance please contact MyChart service desk [phone])**  **If using a computer, in order to ensure the best quality for their visit they will need to use either of the following Internet Browsers: [employer], or Google Chrome**  IF USING DOXIMITY or DOXY.ME - The patient will receive a link just prior to their visit by text.     FULL LENGTH CONSENT FOR TELE-HEALTH VISIT   I hereby voluntarily request, consent and authorize [facility] and its employed or contracted physicians, physician assistants, nurse practitioners or other licensed health care professionals (the Practitioner), to provide me with telemedicine health care services (the Services") as deemed necessary by the treating Practitioner. I acknowledge and consent to receive the Services by the Practitioner via telemedicine. I understand that the telemedicine visit will involve communicating with the Practitioner through live audiovisual communication technology and the disclosure of certain medical information by electronic transmission. I acknowledge that I have been given the opportunity to request an in-person assessment or other available alternative prior to the telemedicine visit and am voluntarily participating in the telemedicine visit.  I understand that I have the right to withhold or withdraw my consent to the use of telemedicine in the course of my care at any time, without affecting my right to future care or treatment, and that the  Practitioner or I may terminate the telemedicine visit at any time. I understand that I have the right to inspect all information obtained and/or recorded in the course of the telemedicine visit and may receive copies of available information for a reasonable fee.  I understand that some of the potential risks of receiving the Services via telemedicine include:   Delay or interruption in medical evaluation due to technological equipment failure or disruption;  Information transmitted may not be sufficient (e.g. poor resolution of images) to allow for appropriate medical decision making by the Practitioner; and/or   In rare instances, security protocols could fail, causing a breach of personal health information.  Furthermore, I acknowledge that it is my responsibility to provide information about my medical history, conditions and care that is complete and accurate to the best of my ability. I acknowledge that Practitioner's advice, recommendations, and/or decision may be based on factors not within their control, such as incomplete or inaccurate data provided by me or distortions of diagnostic images or specimens that may result from electronic transmissions. I  understand that the practice of medicine is not an exact science and that Practitioner makes no warranties or guarantees regarding treatment outcomes. I acknowledge that I will receive a copy of this consent concurrently upon execution via email to the email address I last provided but may also request a printed copy by calling the office of [facility].    I understand that my insurance will be billed for this visit.   I have read or had this consent read to me.  I understand the contents of this consent, which adequately explains the benefits and risks of the Services being provided via telemedicine.   I have been provided ample opportunity to ask questions regarding this consent and the Services and have had my questions answered to my  satisfaction.  I give my informed consent for the services to be provided through the use of telemedicine in my medical care  By participating in this telemedicine visit I agree to the above.

## 2018-11-05 ENCOUNTER — Ambulatory Visit: Payer: BC Managed Care – PPO | Admitting: Cardiology

## 2018-11-09 ENCOUNTER — Telehealth (INDEPENDENT_AMBULATORY_CARE_PROVIDER_SITE_OTHER): Payer: BC Managed Care – PPO | Admitting: Cardiology

## 2018-11-09 ENCOUNTER — Encounter: Payer: Self-pay | Admitting: Cardiology

## 2018-11-09 VITALS — BP 138/90 | HR 83 | Ht 72.0 in | Wt 173.0 lb

## 2018-11-09 DIAGNOSIS — I471 Supraventricular tachycardia: Secondary | ICD-10-CM | POA: Diagnosis not present

## 2018-11-09 DIAGNOSIS — I10 Essential (primary) hypertension: Secondary | ICD-10-CM

## 2018-11-09 NOTE — Patient Instructions (Addendum)
Your physician recommends that you schedule a follow-up appointment in: [place]  Your physician recommends that you continue on your current medications as directed. Please refer to the Current Medication list given to you today.  Thank you for choosing [facility]!!

## 2018-11-09 NOTE — Progress Notes (Signed)
Virtual Visit via Telephone Note   This visit type was conducted due to national recommendations for restrictions regarding the COVID-19 Pandemic (e.g. social distancing) in an effort to limit this patient's exposure and mitigate transmission in our community.  Due to his co-morbid illnesses, this patient is at least at moderate risk for complications without adequate follow up.  This format is felt to be most appropriate for this patient at this time.  The patient did not have access to video technology/had technical difficulties with video requiring transitioning to audio format only (telephone).  All issues noted in this document were discussed and addressed.  No physical exam could be performed with this format.  Please refer to the patient's chart for his  consent to telehealth for [facility]   Date:  11/09/2018   ID:  Leonard Armstrong, DOB [birth year], MRN [id number]  Patient Location: Home Provider Location: Office  PCP:  Mikey Kirschner, MD  Cardiologist:  Carlyle Dolly, MD  Electrophysiologist:  None   Evaluation Performed:  Follow-Up Visit  Chief Complaint:  F/U PSVT, HTN  History of Present Illness:    Leonard Armstrong is a 63 y.o. male Leonard Armstrong is a 63 y.o.male seen today for follow up of the following medical problems.  1.PSVT  Patient states he had one episode of tachycardia one week ago today and resolved with an extra dose of diltiazem and vagal maneuvers. States the episode last approximately 7-9 minutes and dissipated. He did not voice any associated symptoms. No recurrence.  2. HTN  Home BP today 138/90. States after last lab work his Spironolactone was discontinued d/t lab results. His Na+ had decreased to 129. His Crt. was 0.97 and GFR 97.   The patient does not have symptoms concerning for COVID-19 infection (fever, chills, cough, or new shortness of breath).     Past Medical History:  Diagnosis Date  . Allergy   . Back pain   . Cancer (HCC)    Skin  . Hypertension   . Loss of smell   . Tachycardia    Past Surgical History:  Procedure Laterality Date  . BACK SURGERY    . HERNIA REPAIR    . VASECTOMY       Current Meds  Medication Sig  . acetaminophen (TYLENOL) 500 MG tablet Take 1,000 mg by mouth every 6 (six) hours as needed for mild pain.  . chlorthalidone (HYGROTON) 25 MG tablet TAKE 1/2 TABLET BY MOUTH EVERY DAY  . clonazePAM (KLONOPIN) 0.5 MG tablet Take one po qd prn anxiety  . diltiazem (CARDIZEM) 60 MG tablet TAKE 1 AND ONE-HALF TABLETS TWICE DAILY  . losartan (COZAAR) 100 MG tablet TAKE 1 TABLET BY MOUTH EVERYDAY AT BEDTIME  . sildenafil (VIAGRA) 100 MG tablet Take 100 mg by mouth as needed for erectile dysfunction.  . [DISCONTINUED] indomethacin (INDOCIN) 50 MG capsule Take 50 mg by mouth 3 (three) times daily.  . [DISCONTINUED] spironolactone (ALDACTONE) 25 MG tablet Take 0.5 tablets (12.5 mg total) by mouth daily.     Allergies:   Other, Prednisone, and Wasp venom   Social History   Tobacco Use  . Smoking status: Current Every Day Smoker    Packs/day: 2.00    Types: Cigarettes    Start date: 01/20/1975  . Smokeless tobacco: Former Systems developer    Types: Chew  Substance Use Topics  . Alcohol use: Yes    Alcohol/week: 42.0 standard drinks    Types: 42 Cans of beer per  week  . Drug use: No     Family Hx: The patient's family history includes Cancer in his father, mother, and another family member; Heart disease in an other family member; Hypertension in his father; Lung cancer in his mother.  ROS:   Please see the history of present illness.    All other systems reviewed and are negative.   Prior CV studies:   The following studies were reviewed today:  *Diagnostic Studies 12/2013 echo Study Conclusions  - Left ventricle: The cavity size was normal. Wall thickness was normal. Systolic function was normal. The estimated ejection fraction was in the range of 60% to 65%. Wall motion was normal;  there were no regional wall motion abnormalities. Left ventricular diastolic function parameters were normal. - Technically adequate study.  Jan 2016 MPI IMPRESSION: 1. Diaphragmatic attenuation noted without clear evidence of scar or significant ischemia.  2. Normal left ventricular wall motion.  3. Left ventricular ejection fraction 69%  4. Low-risk stress test findings*.    Labs/Other Tests and Data Reviewed:    EKG:  No ECG reviewed.  Recent Labs: Last labs in August  2020 Na 129, Crt 0.79, Gfr 97.   Recent Lipid Panel No results found for: CHOL, TRIG, HDL, CHOLHDL, LDLCALC, LDLDIRECT  Wt Readings from Last 3 Encounters:  11/09/18 173 lb (78.5 kg)  01/21/18 172 lb 9.6 oz (78.3 kg)  08/20/17 167 lb (75.8 kg)     Objective:    Vital Signs:  BP 138/90   Pulse 83   Ht 6' (1.829 m)   Wt 173 lb (78.5 kg)   BMI 23.46 kg/m    Normal Speech pattern, Speech appropriate, Normal affect, No sounds of acute distress  ASSESSMENT & PLAN:      1. PSVT -doing well on 90 mg Diltiazem dosing BID , continue current therapy. Patient has experienced a solitary episode of tachycardia 1 week ago today lasting 7-9 minutes and resolved with taking and extra dose of diltiazem and performing vagal maneuvers.  - room to titrate dilt if needed,  If ever refractory symptoms would refer to EP to consider ablation. Advised to restrict stimulant intake to possibly reduce incidence of episodes.   2. HTN On chlorthalidone 12.5mg  daily. DBP slightly elevated today. States this is about average for him. Advised to check BP a little more frequently and report sustained elevated BP's at or above 140/90. Was hyponatremic on Spironolactone which was d/c'd  Patient had questions about possible Stem Cell injection treatment for shoulder issues. Advised patient there shouldn't be an issues to prohibit him from pursuing this therapy. Patient states since he is allergic to steroids, he could  not pursue that particular therapy.  COVID-19 Education: The signs and symptoms of COVID-19 were discussed with the patient and how to seek care for testing (follow up with PCP or arrange E-visit).  The importance of social distancing was discussed today.  Time:   Today, I have spent 10 minutes with the patient with telehealth technology discussing the above problems.     Medication Adjustments/Labs and Tests Ordered: Current medicines are reviewed at length with the patient today.  Concerns regarding medicines are outlined above.   Tests Ordered: No orders of the defined types were placed in this encounter.   Medication Changes: No orders of the defined types were placed in this encounter.   Follow Up:  Either In Person or Virtual in 10 month(s)  Signed, Verta Ellen, NP  11/09/2018 10:04 AM  [place]  Attending note: Patient discussed with NP Leonides Sake and documentation reviewed, I agree with the assessment and plan and have made neccesary changes were needed above.  Carlyle Dolly MD

## 2018-11-09 NOTE — Progress Notes (Deleted)
{Choose 1 Note Type (Telehealth Visit or Telephone Visit):[phone]}   Date:  11/09/2018   ID:  Leonard Armstrong, DOB [birth year], MRN [id number]  {Patient Location:[phone]::"Home"} {Provider [phone]::"Home"}  PCP:  Mikey Kirschner, MD  Cardiologist:  Carlyle Dolly, MD *** Electrophysiologist:  None   Evaluation Performed:  {Choose Visit Type:[phone]::"Follow-Up Visit"}  Chief Complaint:  ***  History of Present Illness:    Leonard Armstrong is a 63 y.o. male seen today for follow up of the following medical problems.  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 Counseled to decreased EtoH,started on beta blocker.  - ER visit 09/2016 with recurrent tachycardia. Notes mention heart rates up to 180, only avialble EKG shows sinus tach around 100 - first episode in quite some time. Recent chest and head cold. Has some caffeine at times - he reports prior higher doses of metoprolol caused fatigue.   - last visit increased dilt to 90mg  bid after reported symptoms, actually had an episode of SVT in the office that broke with vagal maneuvers.  - since med increase no significant symptoms, 2 short lived episodes of palpitations.   2. 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   The patient {does/does not:200015} have symptoms concerning for COVID-19 infection (fever, chills, cough, or new shortness of breath).    Past Medical History:  Diagnosis Date  . Allergy   . Back pain   . Cancer (HCC)    Skin  . Hypertension   . Loss of smell   . Tachycardia    Past Surgical History:  Procedure Laterality Date  . BACK SURGERY    . HERNIA REPAIR    . VASECTOMY       No outpatient medications have been marked as taking for the 11/09/18 encounter (Appointment) with Leonard Lenis, MD.     Allergies:   Other, Prednisone, and Wasp venom   Social History   Tobacco Use  . Smoking status:  Current Every Day Smoker    Packs/day: 2.00    Types: Cigarettes    Start date: 01/20/1975  . Smokeless tobacco: Former Systems developer    Types: Chew  Substance Use Topics  . Alcohol use: Yes    Alcohol/week: 42.0 standard drinks    Types: 42 Cans of beer per week  . Drug use: No     Family Hx: The patient's family history includes Cancer in his father, mother, and another family member; Heart disease in an other family member; Hypertension in his father; Lung cancer in his mother.  ROS:   Please see the history of present illness.    *** All other systems reviewed and are negative.   Prior CV studies:   The following studies were reviewed today:  12/2013 echo Study Conclusions  - Left ventricle: The cavity size was normal. Wall thickness was normal. Systolic function was normal. The estimated ejection fraction was in the range of 60% to 65%. Wall motion was normal; there were no regional wall motion abnormalities. Left ventricular diastolic function parameters were normal. - Technically adequate study.  Jan 2016 MPI IMPRESSION: 1. Diaphragmatic attenuation noted without clear evidence of scar or significant ischemia.  2. Normal left ventricular wall motion.  3. Left ventricular ejection fraction 69%  4. Low-risk stress test findings*.  Labs/Other Tests and Data Reviewed:    EKG:  {EKG/Telemetry Strips Reviewed:[id number]}  Recent Labs: No results found for requested labs within last 8760 hours.  Recent Lipid Panel No results found for: CHOL, TRIG, HDL, CHOLHDL, LDLCALC, LDLDIRECT  Wt Readings from Last 3 Encounters:  01/21/18 172 lb 9.6 oz (78.3 kg)  08/20/17 167 lb (75.8 kg)  03/03/17 164 lb 9.6 oz (74.7 kg)     Objective:    Vital Signs:  There were no vitals taken for this visit.   {[facility] Virtual Exam (Optional):[phone]::"VITAL SIGNS:  reviewed"}  ASSESSMENT & PLAN:    1. PSVT -doing well on higher dilt dosing, continue current  therapy - room to titrate dilt if needed, side effects on lopressor prevously. If ever refractory symptoms would refer to EP to consider ablation  2. HTN - above goal, start chlorthalidone 12.5mg  daily - in 2 weeks check BMET/Mg, submit bp log  COVID-19 Education: The signs and symptoms of COVID-19 were discussed with the patient and how to seek care for testing (follow up with PCP or arrange E-visit).  ***The importance of social distancing was discussed today.  Time:   Today, I have spent *** minutes with the patient with telehealth technology discussing the above problems.     Medication Adjustments/Labs and Tests Ordered: Current medicines are reviewed at length with the patient today.  Concerns regarding medicines are outlined above.   Tests Ordered: No orders of the defined types were placed in this encounter.   Medication Changes: No orders of the defined types were placed in this encounter.   Follow Up:  {F/U Format:[phone]} {follow up:15908}  Signed, Carlyle Dolly, MD  11/09/2018 9:29 AM     [facility]

## 2018-12-06 ENCOUNTER — Other Ambulatory Visit: Payer: Self-pay | Admitting: *Deleted

## 2018-12-06 MED ORDER — LOSARTAN POTASSIUM 100 MG PO TABS: ORAL_TABLET | ORAL | 3 refills | Status: DC

## 2018-12-22 IMAGING — CR DG CHEST 1V PORT
1 series · 1 of 1 positions shown · non-contrast
Comparison: Chest x-ray dated 12/25/2013.

CLINICAL DATA: Chest pain and tachycardia today.

EXAM:
PORTABLE CHEST 1 VIEW

[portable]
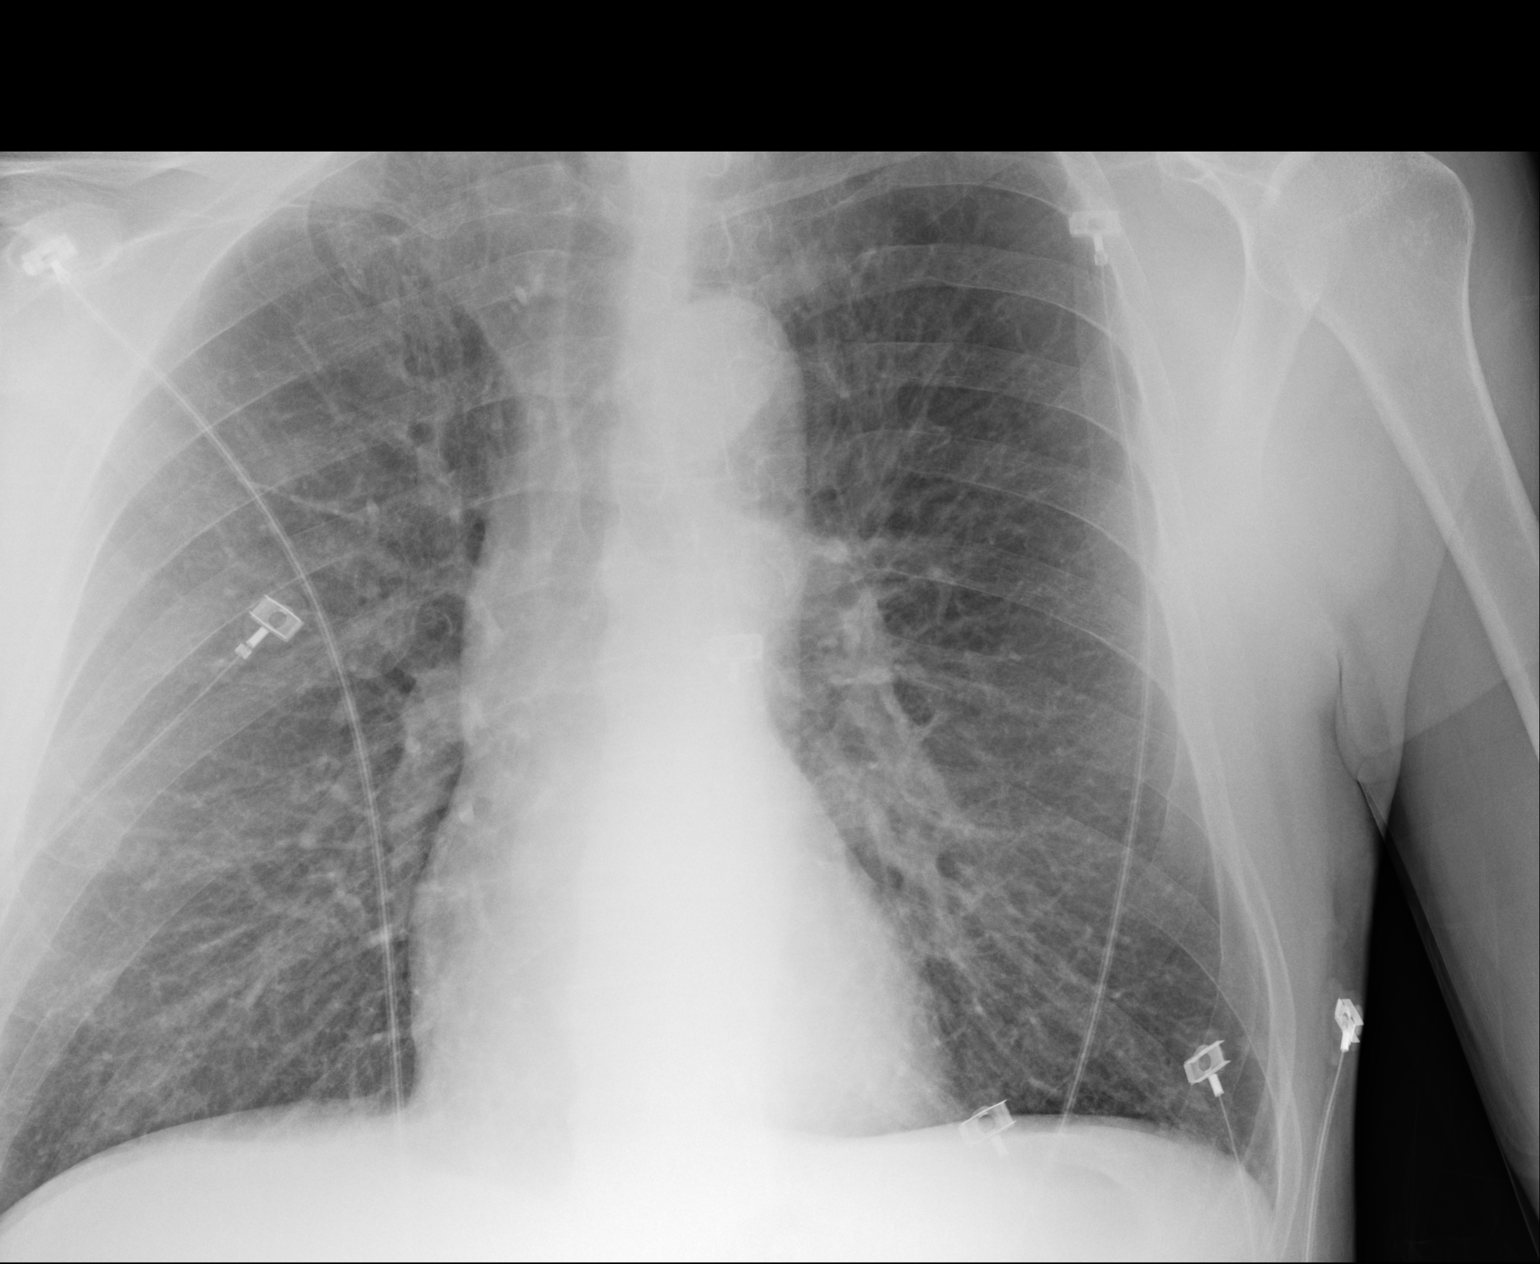

[1 of 1 positions shown; findings below may reference images not displayed]

FINDINGS: Heart size and mediastinal contours remain within normal limits.

Right costophrenic angle is excluded on this exam. Lungs appear to
be at least mildly hyperexpanded suggesting COPD. Coarse lung
markings noted bilaterally suggesting associated chronic
interstitial lung disease and/or chronic bronchitic changes. No
pleural effusion or pneumothorax seen.

No acute or suspicious osseous finding.
IMPRESSION: 1. Lungs appear hyperexpanded suggesting COPD. Suspect associated
chronic interstitial lung disease and/or chronic bronchitic changes.
2. No evidence of pneumonia or pulmonary edema.
3. Heart size is normal.

## 2019-02-10 ENCOUNTER — Encounter: Payer: Self-pay | Admitting: Family Medicine

## 2019-02-18 ENCOUNTER — Other Ambulatory Visit: Payer: Self-pay | Admitting: Cardiology

## 2019-03-28 ENCOUNTER — Other Ambulatory Visit: Payer: Self-pay | Admitting: *Deleted

## 2019-03-28 MED ORDER — DILTIAZEM HCL 60 MG PO TABS: ORAL_TABLET | ORAL | 1 refills | Status: DC

## 2019-03-31 ENCOUNTER — Ambulatory Visit: Payer: BC Managed Care – PPO | Attending: Internal Medicine

## 2019-03-31 DIAGNOSIS — Z23 Encounter for immunization: Secondary | ICD-10-CM

## 2019-03-31 NOTE — Progress Notes (Signed)
   Covid-19 Vaccination Clinic  Name:  Leonard Armstrong    MRN: [id number] DOB: [birth year]  03/31/2019  [name] was observed post Covid-19 immunization for 30 minutes based on pre-vaccination screening without incident. He was provided with Vaccine Information Sheet and instruction to access the V-Safe system.   [name] was instructed to call 911 with any severe reactions post vaccine: Marland Kitchen Difficulty breathing  . Swelling of face and throat  . A fast heartbeat  . A bad rash all over body  . Dizziness and weakness   Immunizations Administered    Name Date Dose VIS Date Route   Moderna COVID-19 Vaccine 03/31/2019 10:09 AM 0.5 mL 12/07/2018 Intramuscular   Manufacturer: Moderna   Lot: [id number]   [id number]

## 2019-05-03 ENCOUNTER — Ambulatory Visit: Payer: BC Managed Care – PPO | Attending: Internal Medicine

## 2019-05-03 DIAGNOSIS — Z23 Encounter for immunization: Secondary | ICD-10-CM

## 2019-05-03 NOTE — Progress Notes (Signed)
   Covid-19 Vaccination Clinic  Name:  DINESH GASSETT    MRN: [id number] DOB: [birth year]  05/03/2019  Mr. Westenskow was observed post Covid-19 immunization for 15 minutes without incident. He was provided with Vaccine Information Sheet and instruction to access the V-Safe system.   Mr. Mandl was instructed to call 911 with any severe reactions post vaccine: Marland Kitchen Difficulty breathing  . Swelling of face and throat  . A fast heartbeat  . A bad rash all over body  . Dizziness and weakness   Immunizations Administered    Name Date Dose VIS Date Route   Moderna COVID-19 Vaccine 05/03/2019  9:33 AM 0.5 mL 12/2018 Intramuscular   Manufacturer: Moderna   Lot: [id number]   [id number]

## 2019-05-24 ENCOUNTER — Telehealth: Payer: Self-pay | Admitting: Cardiology

## 2019-05-24 NOTE — Telephone Encounter (Signed)
Pt c/o palpitations radiating to jaw on the left side lasting about 10 mins with some SOB/dizziness - says he took tylenol and still having dull chest pressure that tylenol has not relived denies any dizziness/SOB since 5am - while on the phone pt checked HR 86 - doesn't know what BP has been - no new medication or changes - pt scheduled with Katina Dung, NP tomorrow at 8am and aware the if symptoms progress he needs evaluation at [facility] ED - pt voiced understanding

## 2019-05-24 NOTE — Telephone Encounter (Signed)
Patient states that around 5AM he started having tachycardia -HR was elevated. Patient is having soreness and now having more pain.

## 2019-05-24 NOTE — Progress Notes (Signed)
Cardiology Office Note  Date: 05/25/2019   ID: Leonard Armstrong, DOB May 21, 1955, MRN [id number]  PCP:  Patient, No Pcp Per  Cardiologist:  Carlyle Dolly, MD Electrophysiologist:  None   Chief Complaint: Follow-up PSVT, HTN  History of Present Illness: Leonard Armstrong is a 64 y.o. male with a history of PSVT, HTN  Last saw Dr. Harl Bowie via telemedicine November 09, 2018.  Patient had experienced 1 episode of tachycardia 1 week prior and it resolved with an extra dose of diltiazem and vagal maneuvers.  He had no associated symptoms.  There were no recurrences.  There was room to titrate the diltiazem if needed and if there were refractory symptoms patient would be referred to EP.  He was advised to restrict stimulant intake to reduce incidence.  His blood pressure was slightly elevated at 138/90.  His spironolactone was DC'd after a lab results showed decrease in sodium to 129.  He was advised to check his blood pressure little more often and report sustained blood pressures at or above 140 over 90s.  Patient called the office on 05/24/2019 stating around 5 AM he started palpitations radiating to jaw and left side lasting about 10 minutes with some shortness of breath symptoms/dizziness.  He took a Tylenol and  complained of a dull chest pressure which was not relieved by Tylenol.  He denied any dizziness or shortness of breath.  While on the phone with clinical staff he checked his heart rate which at that time was 86.  Patient states yesterday when the above event occurred it lasted for couple hours.  Did not respond to some vagal maneuvers.  States he did try carotid massage and the arrhythmia finally broke.  He also took Cardizem during that time.  He states the chest pain was not new but states when this happens he clenches his body and is wondering if this may have contributed to the chest pain.  He states this is the first time he has had chest pain along with the palpitations.  States he did  have some dizziness and some shortness of breath.  He denies any nausea, vomiting, presyncopal/syncopal episode.  Past Medical History:  Diagnosis Date  . Allergy   . Back pain   . Cancer (HCC)    Skin  . Hypertension   . Loss of smell   . Tachycardia     Past Surgical History:  Procedure Laterality Date  . BACK SURGERY    . HERNIA REPAIR    . VASECTOMY      Current Outpatient Medications  Medication Sig Dispense Refill  . acetaminophen (TYLENOL) 500 MG tablet Take 1,000 mg by mouth every 6 (six) hours as needed for mild pain.    . chlorthalidone (HYGROTON) 25 MG tablet TAKE 1/2 TABLET BY MOUTH EVERY DAY 45 tablet 1  . clonazePAM (KLONOPIN) 0.5 MG tablet Take one po qd prn anxiety 24 tablet 1  . diltiazem (CARDIZEM) 60 MG tablet TAKE 1 AND ONE-HALF TABLETS TWICE DAILY 270 tablet 1  . losartan (COZAAR) 100 MG tablet TAKE 1 TABLET BY MOUTH EVERYDAY AT BEDTIME 90 tablet 3  . sildenafil (VIAGRA) 100 MG tablet Take 100 mg by mouth as needed for erectile dysfunction.     No current facility-administered medications for this visit.   Allergies:  Other, Prednisone, and Wasp venom   Social History: The patient  reports that he has been smoking cigarettes. He started smoking about 44 years ago. He has been smoking  about 2.00 packs per day. He has quit using smokeless tobacco.  His smokeless tobacco use included chew. He reports current alcohol use of about 42.0 standard drinks of alcohol per week. He reports that he does not use drugs.   Family History: The patient's family history includes Cancer in his father, mother, and another family member; Heart disease in an other family member; Hypertension in his father; Lung cancer in his mother.   ROS:  Please see the history of present illness. Otherwise, complete review of systems is positive for none.  All other systems are reviewed and negative.   Physical Exam: VS:  BP 140/80   Pulse 86   Ht 6' (1.829 m)   Wt 166 lb 9.6 oz (75.6 kg)    SpO2 96%   BMI 22.60 kg/m , BMI Body mass index is 22.6 kg/m.  Wt Readings from Last 3 Encounters:  05/25/19 166 lb 9.6 oz (75.6 kg)  11/09/18 173 lb (78.5 kg)  01/21/18 172 lb 9.6 oz (78.3 kg)    General: Patient appears comfortable at rest. Neck: Supple, no elevated JVP or carotid bruits, no thyromegaly. Lungs: Clear to auscultation, nonlabored breathing at rest. Cardiac: Regular rate and rhythm, no S3 or significant systolic murmur, no pericardial rub. Extremities: No pitting edema, distal pulses 2+. Skin: Warm and dry. Musculoskeletal: No kyphosis. Neuropsychiatric: Alert and oriented x3, affect grossly appropriate.  ECG:  An ECG dated 05/25/2019 was personally reviewed today and demonstrated:  Sinus rhythm with first-degree block rate of 86.  Recent Labwork: No results found for requested labs within last 8760 hours.  No results found for: CHOL, TRIG, HDL, CHOLHDL, VLDL, LDLCALC, LDLDIRECT  Other Studies Reviewed Today:  *Diagnostic Studies 12/2013 echo Study Conclusions  - Left ventricle: The cavity size was normal. Wall thickness was normal. Systolic function was normal. The estimated ejection fraction was in the range of 60% to 65%. Wall motion was normal; there were no regional wall motion abnormalities. Left ventricular diastolic function parameters were normal. - Technically adequate study.  Jan 2016 MPI IMPRESSION: 1. Diaphragmatic attenuation noted without clear evidence of scar or significant ischemia.  Assessment and Plan:  1. PSVT (paroxysmal supraventricular tachycardia) (HCC)   2. Chest pain of uncertain etiology   3. Essential hypertension   4. Palpitations      1. PSVT/palpitations (paroxysmal supraventricular tachycardia) (HCC) Yesterday the patient had an episode of palpitations/racing heart.  With associated dizziness and some shortness of breath accompanied by chest pain.  He tried vagal maneuvers and this seemed to not work  initially but it eventually resolved.  He rechecked his heart rate while on the phone with the nursing staff and it had decreased to 86 and he was not experiencing palpitations.  He did take his Cardizem.  Start diltiazem CD 120 mg daily.  If you have breakthrough palpitations after starting this medication take 60 mg of your regular Cardizem for breakthrough palpitations as needed.  2. Chest pain of uncertain etiology Chest pain yesterday associated with prolonged episode of palpitations.  He states chest pain has not occurred in the past with palpitations.  He states once palpitations resolved he still had some chest pain/chest soreness which was reproducible with palpation and pressure.  Get a [place] nuclear stress test.  3. Essential hypertension Blood pressure 140/80 today.  Continue chlorthalidone 25 mg 1/2 tablet daily.  Continue losartan 100 mg daily.   Medication Adjustments/Labs and Tests Ordered: Current medicines are reviewed at length with the patient today.  Concerns regarding medicines are outlined above.   Disposition: Follow-up with Dr. Harl Bowie or APP 1 month Signed, Levell July, NP 05/25/2019 8:36 AM    [facility] [place], [place], [place] [address] Phone: [phone]; Fax: [phone] point

## 2019-05-25 ENCOUNTER — Other Ambulatory Visit: Payer: Self-pay

## 2019-05-25 ENCOUNTER — Encounter: Payer: Self-pay | Admitting: Family Medicine

## 2019-05-25 ENCOUNTER — Encounter: Payer: Self-pay | Admitting: *Deleted

## 2019-05-25 ENCOUNTER — Ambulatory Visit (INDEPENDENT_AMBULATORY_CARE_PROVIDER_SITE_OTHER): Payer: BC Managed Care – PPO | Admitting: Family Medicine

## 2019-05-25 ENCOUNTER — Telehealth: Payer: Self-pay | Admitting: Family Medicine

## 2019-05-25 VITALS — BP 140/80 | HR 86 | Ht 72.0 in | Wt 166.6 lb

## 2019-05-25 DIAGNOSIS — I10 Essential (primary) hypertension: Secondary | ICD-10-CM | POA: Diagnosis not present

## 2019-05-25 DIAGNOSIS — R079 Chest pain, unspecified: Secondary | ICD-10-CM

## 2019-05-25 DIAGNOSIS — R002 Palpitations: Secondary | ICD-10-CM

## 2019-05-25 DIAGNOSIS — I471 Supraventricular tachycardia: Secondary | ICD-10-CM

## 2019-05-25 MED ORDER — DILTIAZEM HCL ER COATED BEADS 120 MG PO CP24: 120.0000 mg | ORAL_CAPSULE | Freq: Every day | ORAL | 3 refills | Status: DC

## 2019-05-25 MED ORDER — DILTIAZEM HCL 60 MG PO TABS: 60.0000 mg | ORAL_TABLET | Freq: Every day | ORAL | 1 refills | Status: DC | PRN

## 2019-05-25 NOTE — Patient Instructions (Addendum)
Medication Instructions:    Your physician has recommended you make the following change in your medication:   Start cardizem cd (diltiazem er) 120 mg by mouth daily  Change diltiazem 60 mg to daily as needed for breakthrough palpitations  Continue other medications the same  Labwork:  NONE  Testing/Procedures: Your physician has requested that you have a lexiscan myoview. For further information please visit [email or web address]. Please follow instruction sheet, as given.  Follow-Up:  Your physician recommends that you schedule a follow-up appointment in: 1 month (office)  Any Other Special Instructions Will Be Listed Below (If Applicable).  If you need a refill on your cardiac medications before your next appointment, please call your pharmacy.

## 2019-05-25 NOTE — Telephone Encounter (Signed)
Pre-cert Verification for the following procedure    LEXISCAN   DATE:06/02/2019  LOCATION: [facility]

## 2019-05-31 ENCOUNTER — Encounter: Payer: Self-pay | Admitting: Gastroenterology

## 2019-06-02 ENCOUNTER — Encounter (HOSPITAL_BASED_OUTPATIENT_CLINIC_OR_DEPARTMENT_OTHER)
Admission: RE | Admit: 2019-06-02 | Discharge: 2019-06-02 | Disposition: A | Discharge status: Home / Self Care | Payer: BC Managed Care – PPO | Source: Ambulatory Visit | Attending: Family Medicine | Admitting: Family Medicine

## 2019-06-02 ENCOUNTER — Other Ambulatory Visit: Payer: Self-pay

## 2019-06-02 ENCOUNTER — Encounter (HOSPITAL_COMMUNITY)
Admission: RE | Admit: 2019-06-02 | Discharge: 2019-06-02 | Disposition: A | Discharge status: Home / Self Care | Payer: BC Managed Care – PPO | Source: Ambulatory Visit | Attending: Family Medicine | Admitting: Family Medicine

## 2019-06-02 DIAGNOSIS — R079 Chest pain, unspecified: Secondary | ICD-10-CM

## 2019-06-02 LAB — NM MYOCAR MULTI W/SPECT W/WALL MOTION / EF
LV dias vol: 90 mL (ref 62–150)
LV sys vol: 19 mL
Peak HR: 116 {beats}/min
RATE: 0.32
Rest HR: 88 {beats}/min
SDS: 0
SRS: 4
SSS: 4
TID: 0.96

## 2019-06-02 MED ORDER — SODIUM CHLORIDE FLUSH 0.9 % IV SOLN
INTRAVENOUS | Status: AC
  Administered 2019-06-02: 10 mL via INTRAVENOUS
  Filled 2019-06-02: qty 10

## 2019-06-02 MED ORDER — TECHNETIUM TC 99M TETROFOSMIN IV KIT
10.0000 | PACK | Freq: Once | INTRAVENOUS | Status: AC | PRN
  Administered 2019-06-02: 10.6 via INTRAVENOUS

## 2019-06-02 MED ORDER — TECHNETIUM TC 99M TETROFOSMIN IV KIT
30.0000 | PACK | Freq: Once | INTRAVENOUS | Status: AC | PRN
  Administered 2019-06-02: 29.7 via INTRAVENOUS

## 2019-06-02 MED ORDER — REGADENOSON 0.4 MG/5ML IV SOLN
INTRAVENOUS | Status: AC
  Administered 2019-06-02: 0.4 mg via INTRAVENOUS
  Filled 2019-06-02: qty 5

## 2019-06-03 ENCOUNTER — Telehealth: Payer: Self-pay | Admitting: *Deleted

## 2019-06-03 NOTE — Telephone Encounter (Signed)
Patient informed. Copy sent to PCP °

## 2019-06-03 NOTE — Telephone Encounter (Signed)
-----   Message from Verta Ellen., NP sent at 06/02/2019  5:05 PM EDT ----- Please call the patient and let him know the stress test did not show any areas of ischemia.  This was a low risk study.  Thank you

## 2019-06-16 NOTE — Progress Notes (Signed)
Cardiology Office Note  Date: 06/17/2019   ID: Leonard, Armstrong 1955-04-15, MRN [id number]  PCP:  Leonard Armstrong., NP  Cardiologist:  Leonard Dolly, MD Electrophysiologist:  None   Chief Complaint: Follow-up PSVT, HTN  History of Present Illness: Leonard Armstrong is a 64 y.o. male with a history of PSVT, HTN  Last saw Dr. Harl Armstrong via telemedicine November 09, 2018.  Patient had experienced 1 episode of tachycardia 1 week prior and it resolved with an extra dose of diltiazem and vagal maneuvers.  He had no associated symptoms.  There were no recurrences.  There was room to titrate the diltiazem if needed and if there were refractory symptoms patient would be referred to EP.  He was advised to restrict stimulant intake to reduce incidence.  His blood pressure was slightly elevated at 138/90.  His spironolactone was DC'd after a lab results showed decrease in sodium to 129.  He was advised to check his blood pressure little more often and report sustained blood pressures at or above 140 over 90s.  Patient called the office on 05/24/2019 stating around 5 AM he started palpitations and pain radiating to jaw and left side lasting about 10 minutes with some shortness of breath symptoms/dizziness.  He took a Tylenol and  complained of a dull chest pressure which was not relieved by Tylenol.  He denied any dizziness or shortness of breath.  While on the phone with clinical staff he checked his heart rate which at that time was 86.  He saw me on 05/25/2019 after complaint of palpitations.  Cardizem 120 mg CD was started.  If he had breakthrough palpitations after starting this medication he was to take 60 mg of his short acting Cardizem for these symptoms.  We ordered a Lexiscan stress test.  His chlorthalidone 12.5 mg and losartan 100 mg were continued.  Stress test showed no diagnostic ST segment changes to indicate ischemia.  Had a small mild intensity inferior/inferior septal defect that was  fixed and most consistent with soft tissue attenuation.  No large ischemic territories.  This was a low risk study calculated EF 79%.   He returns today for follow-up status post initiation of Cardizem CD.  He states he is having much less palpitations.  He states initially for the first 3 to 4 days he had brief periods of palpitations and took the extra 60 mg short acting diltiazem but since then has not had to take any.  He denies any other progressive anginal or exertional symptoms, palpitations or arrhythmias, orthostatic symptoms, CVA or TIA-like symptoms, PND, orthopnea, lower extremity edema.   Past Medical History:  Diagnosis Date  . Allergy   . Back pain   . Cancer (HCC)    Skin  . Hypertension   . Loss of smell   . Tachycardia     Past Surgical History:  Procedure Laterality Date  . BACK SURGERY    . HERNIA REPAIR    . VASECTOMY      Current Outpatient Medications  Medication Sig Dispense Refill  . acetaminophen (TYLENOL) 500 MG tablet Take 1,000 mg by mouth every 6 (six) hours as needed for mild pain.    . chlorthalidone (HYGROTON) 25 MG tablet TAKE 1/2 TABLET BY MOUTH EVERY DAY 45 tablet 1  . clonazePAM (KLONOPIN) 0.5 MG tablet Take one po qd prn anxiety 24 tablet 1  . diltiazem (CARDIZEM CD) 120 MG 24 hr capsule Take 1 capsule (120 mg total)  by mouth daily. 90 capsule 3  . diltiazem (CARDIZEM) 60 MG tablet Take 1 tablet (60 mg total) by mouth daily as needed (for breakthrough palpiations). 90 tablet 1  . losartan (COZAAR) 100 MG tablet TAKE 1 TABLET BY MOUTH EVERYDAY AT BEDTIME 90 tablet 3  . sildenafil (VIAGRA) 100 MG tablet Take 100 mg by mouth as needed for erectile dysfunction.     No current facility-administered medications for this visit.   Allergies:  Other, Prednisone, and Wasp venom   Social History: The patient  reports that he has been smoking cigarettes. He started smoking about 44 years ago. He has been smoking about 2.00 packs per day. He has quit  using smokeless tobacco.  His smokeless tobacco use included chew. He reports current alcohol use of about 42.0 standard drinks of alcohol per week. He reports that he does not use drugs.   Family History: The patient's family history includes Cancer in his father, mother, and another family member; Heart disease in an other family member; Hypertension in his father; Lung cancer in his mother.   ROS:  Please see the history of present illness. Otherwise, complete review of systems is positive for none.  All other systems are reviewed and negative.   Physical Exam: VS:  BP 128/88   Pulse 94   Ht 5\' 11"  (1.803 m)   Wt 163 lb (73.9 kg)   SpO2 96%   BMI 22.73 kg/m , BMI Body mass index is 22.73 kg/m.  Wt Readings from Last 3 Encounters:  06/17/19 163 lb (73.9 kg)  05/25/19 166 lb 9.6 oz (75.6 kg)  11/09/18 173 lb (78.5 kg)    General: Patient appears comfortable at rest. Neck: Supple, no elevated JVP or carotid bruits, no thyromegaly. Lungs: Clear to auscultation, nonlabored breathing at rest. Cardiac: Regular rate and rhythm, no S3 or significant systolic murmur, no pericardial rub. Extremities: No pitting edema, distal pulses 2+. Skin: Warm and dry. Musculoskeletal: No kyphosis. Neuropsychiatric: Alert and oriented x3, affect grossly appropriate.  ECG:  An ECG dated 05/25/2019 was personally reviewed today and demonstrated:  Sinus rhythm with first-degree block rate of 86.  Recent Labwork: No results found for requested labs within last 8760 hours.  No results found for: CHOL, TRIG, HDL, CHOLHDL, VLDL, LDLCALC, LDLDIRECT  Other Studies Reviewed Today:  Study Result MPI 06/02/2019  Narrative & Impression   No diagnostic ST segment changes to indicate ischemia.  Small, mild intensity, inferior/inferoseptal defect that is fixed and most consistent with soft tissue attenuation. No large ischemic territories.  This is a low risk study.  Nuclear stress EF: 79%.    *Diagnostic  Studies 12/2013 echo Study Conclusions  - Left ventricle: The cavity size was normal. Wall thickness was normal. Systolic function was normal. The estimated ejection fraction was in the range of 60% to 65%. Wall motion was normal; there were no regional wall motion abnormalities. Left ventricular diastolic function parameters were normal. - Technically adequate study.  Jan 2016 MPI IMPRESSION: 1. Diaphragmatic attenuation noted without clear evidence of scar or significant ischemia.  Assessment and Plan:  1. PSVT/palpitations (paroxysmal supraventricular tachycardia) ([place]) He states since initiation of Cardizem CD 120 mg his palpitations have decreased significantly.  He states he had to take a couple of doses of short acting diltiazem the first 3 days after initiating therapy but has not had to take any since that time.  Denies any current palpitations.  Continue Cardizem CD 120 mg daily and 60 mg of  Cardizem as needed for breakthrough palpitations.   2. Chest pain of uncertain etiology Chest pain at last visit associated with palpitations and pressure.  A Lexiscan stress test was ordered and was negative for ischemia / low risk study.  Denies any chest pain since starting long-acting Cardizem.  3. Essential hypertension Blood pressure is reasonably well controlled today at home 128/88  Continue chlorthalidone 25 mg 1/2 tablet daily.  Continue losartan 100 mg daily.   Medication Adjustments/Labs and Tests Ordered: Current medicines are reviewed at length with the patient today.  Concerns regarding medicines are outlined above.   Disposition: Follow-up with Dr. Harl Armstrong or APP 6 months   signed, Levell July, NP 06/17/2019 8:23 AM    [facility] [place], [place], [place] [address] Phone: [phone]; Fax: [phone] point

## 2019-06-17 ENCOUNTER — Encounter: Payer: Self-pay | Admitting: Family Medicine

## 2019-06-17 ENCOUNTER — Other Ambulatory Visit: Payer: Self-pay

## 2019-06-17 ENCOUNTER — Ambulatory Visit: Payer: BC Managed Care – PPO | Admitting: Family Medicine

## 2019-06-17 VITALS — BP 128/88 | HR 94 | Ht 71.0 in | Wt 163.0 lb

## 2019-06-17 DIAGNOSIS — I471 Supraventricular tachycardia: Secondary | ICD-10-CM

## 2019-06-17 DIAGNOSIS — R079 Chest pain, unspecified: Secondary | ICD-10-CM | POA: Diagnosis not present

## 2019-06-17 DIAGNOSIS — I10 Essential (primary) hypertension: Secondary | ICD-10-CM | POA: Diagnosis not present

## 2019-06-17 NOTE — Patient Instructions (Addendum)

## 2019-08-19 ENCOUNTER — Other Ambulatory Visit: Payer: Self-pay | Admitting: Cardiology

## 2019-12-06 ENCOUNTER — Other Ambulatory Visit: Payer: Self-pay | Admitting: Cardiology

## 2019-12-15 NOTE — Progress Notes (Addendum)
This is a telemedicine visit Patient location: Home Provider location: Office   Cardiology Note  Date: 12/16/2019   ID: Ahad, Colarusso Apr 27, 1955, MRN [id number]  PCP:  Verta Ellen., NP  Cardiologist:  Carlyle Dolly, MD Electrophysiologist:  None   Chief Complaint: Follow-up PSVT, HTN  History of Present Illness: Leonard Armstrong is a 64 y.o. male with a history of PSVT, HTN  Last saw Dr. Harl Bowie via telemedicine November 09, 2018.  Patient had experienced 1 episode of tachycardia 1 week prior and it resolved with an extra dose of diltiazem and vagal maneuvers.  He had no associated symptoms.  There were no recurrences.  There was room to titrate the diltiazem if needed and if there were refractory symptoms patient would be referred to EP.  He was advised to restrict stimulant intake to reduce incidence.  His blood pressure was slightly elevated at 138/90.  His spironolactone was DC'd after a lab results showed decrease in sodium to 129.  He was advised to check his blood pressure little more often and report sustained blood pressures at or above 140 over 90s.  Patient called the office on 05/24/2019 stating around 5 AM he started palpitations and pain radiating to jaw and left side lasting about 10 minutes with some shortness of breath symptoms/dizziness.  He took a Tylenol and  complained of a dull chest pressure which was not relieved by Tylenol.  He denied any dizziness or shortness of breath.  While on the phone with clinical staff he checked his heart rate which at that time was 86.  He saw me on 05/25/2019 after complaint of palpitations.  Cardizem 120 mg CD was started.  If he had breakthrough palpitations after starting this medication he was to take 60 mg of his short acting Cardizem for these symptoms.  We ordered a Lexiscan stress test.  His chlorthalidone 12.5 mg and losartan 100 mg were continued.  Stress test showed no diagnostic ST segment changes to indicate  ischemia.  Had a small mild intensity inferior/inferior septal defect that was fixed and most consistent with soft tissue attenuation.  No large ischemic territories.  This was a low risk study calculated EF 79%.   He returns today for follow-up status post initiation of Cardizem CD.  He states he is having much less palpitations.  He states initially for the first 3 to 4 days he had brief periods of palpitations and took the extra 60 mg short acting diltiazem but since then has not had to take any.  He denies any other progressive anginal or exertional symptoms, palpitations or arrhythmias, orthostatic symptoms, CVA or TIA-like symptoms, PND, orthopnea, lower extremity edema.   Past Medical History:  Diagnosis Date  . Allergy   . Back pain   . Cancer (HCC)    Skin  . Hypertension   . Loss of smell   . Tachycardia     Past Surgical History:  Procedure Laterality Date  . BACK SURGERY    . HERNIA REPAIR    . VASECTOMY      Current Outpatient Medications  Medication Sig Dispense Refill  . acetaminophen (TYLENOL) 500 MG tablet Take 1,000 mg by mouth every 6 (six) hours as needed for mild pain.    . chlorthalidone (HYGROTON) 25 MG tablet TAKE 1/2 TABLET BY MOUTH EVERY DAY 45 tablet 3  . clonazePAM (KLONOPIN) 0.5 MG tablet Take one po qd prn anxiety 24 tablet 1  . diltiazem (CARDIZEM CD)  120 MG 24 hr capsule Take 1 capsule (120 mg total) by mouth daily. 90 capsule 3  . diltiazem (CARDIZEM) 60 MG tablet Take 1 tablet (60 mg total) by mouth daily as needed (for breakthrough palpiations). 90 tablet 1  . losartan (COZAAR) 100 MG tablet TAKE 1 TABLET BY MOUTH EVERY NIGHT AT BEDTIME 90 tablet 2  . sildenafil (VIAGRA) 100 MG tablet Take 100 mg by mouth as needed for erectile dysfunction.     No current facility-administered medications for this visit.   Allergies:  Other, Prednisone, and Wasp venom   Social History: The patient  reports that he has been smoking cigarettes. He started smoking  about 44 years ago. He has been smoking about 2.00 packs per day. He has quit using smokeless tobacco.  His smokeless tobacco use included chew. He reports current alcohol use of about 42.0 standard drinks of alcohol per week. He reports that he does not use drugs.   Family History: The patient's family history includes Cancer in his father, mother, and another family member; Heart disease in an other family member; Hypertension in his father; Lung cancer in his mother.   ROS:  Please see the history of present illness. Otherwise, complete review of systems is positive for none.  All other systems are reviewed and negative.   Physical Exam: VS:  BP (!) 143/87   Pulse 92   Ht 6' (1.829 m)   Wt 171 lb (77.6 kg)   BMI 23.19 kg/m , BMI Body mass index is 23.19 kg/m.  Wt Readings from Last 3 Encounters:  12/16/19 171 lb (77.6 kg)  06/17/19 163 lb (73.9 kg)  05/25/19 166 lb 9.6 oz (75.6 kg)    Spoke in a normal speech pattern.  No evidence of dyspnea, cough, or wheezing noted.  ECG:  An ECG dated 05/25/2019 was personally reviewed today and demonstrated:  Sinus rhythm with first-degree block rate of 86.  Recent Labwork: No results found for requested labs within last 8760 hours.  No results found for: CHOL, TRIG, HDL, CHOLHDL, VLDL, LDLCALC, LDLDIRECT  Other Studies Reviewed Today:  Study Result MPI 06/02/2019  Narrative & Impression   No diagnostic ST segment changes to indicate ischemia.  Small, mild intensity, inferior/inferoseptal defect that is fixed and most consistent with soft tissue attenuation. No large ischemic territories.  This is a low risk study.  Nuclear stress EF: 79%.    12/2013 echo Study Conclusions  - Left ventricle: The cavity size was normal. Wall thickness was normal. Systolic function was normal. The estimated ejection fraction was in the range of 60% to 65%. Wall motion was normal; there were no regional wall motion abnormalities.  Left ventricular diastolic function parameters were normal. - Technically adequate study.  Jan 2016 MPI IMPRESSION: 1. Diaphragmatic attenuation noted without clear evidence of scar or significant ischemia.  Assessment and Plan:   1. PSVT/palpitations (paroxysmal supraventricular tachycardia) ([place]) Spoke to the patient today and he states that Cardizem CD 120 had significantly decreased his palpitations.  States he has had about 3 episodes of breakthrough palpitations where he either used self carotid massage and or 60 mg of short acting Cardizem for breakthrough palpitations.  Increase Cardizem ED to 180 mg daily.  May take extra dose of short acting Cardizem 60 mg as needed for breakthrough palpitations   2. Chest pain of uncertain etiology Chest pain at last visit associated with palpitations and pressure.  A Lexiscan stress test was ordered and was negative for ischemia /  low risk study.  Denies any current anginal or exertional symptoms.  3. Essential hypertension Blood pressure is elevated today at 143/87.  We are increasing Cardizem CD to 180 mg daily.  Hopefully this will help with blood pressure as well as palpitations.  Continue chlorthalidone 25 mg 1/2 tablet daily.  Continue losartan 100 mg daily.   Medication Adjustments/Labs and Tests Ordered: Current medicines are reviewed at length with the patient today.  Concerns regarding medicines are outlined above.   Spoke with patient via telemedicine for 10 minutes  Disposition: Follow-up with Dr. Harl Bowie or APP 6 months   signed, Levell July, NP 12/16/2019 11:42 AM    [facility] [place], [place], [place] [address] Phone: [phone]; Fax: [phone] point

## 2019-12-16 ENCOUNTER — Encounter: Payer: Self-pay | Admitting: Family Medicine

## 2019-12-16 ENCOUNTER — Telehealth (INDEPENDENT_AMBULATORY_CARE_PROVIDER_SITE_OTHER): Payer: BC Managed Care – PPO | Admitting: Family Medicine

## 2019-12-16 DIAGNOSIS — R079 Chest pain, unspecified: Secondary | ICD-10-CM | POA: Diagnosis not present

## 2019-12-16 DIAGNOSIS — I10 Essential (primary) hypertension: Secondary | ICD-10-CM

## 2019-12-16 DIAGNOSIS — I471 Supraventricular tachycardia: Secondary | ICD-10-CM

## 2019-12-16 MED ORDER — DILTIAZEM HCL ER COATED BEADS 180 MG PO CP24
180.0000 mg | ORAL_CAPSULE | Freq: Every day | ORAL | 1 refills | Status: DC
Start: 2019-12-16 — End: 2020-06-15

## 2019-12-16 NOTE — Patient Instructions (Addendum)
Your physician wants you to follow-up in: [place] has recommended you make the following change in your medication:   STOP DILTIAZEM 120 MG   START DILTIAZEM 180 MG DAILY   Thank you for choosing [facility]!!

## 2019-12-16 NOTE — Addendum Note (Signed)
Addended by: Julian Hy T on: 12/16/2019 12:50 PM   Modules accepted: Orders

## 2020-02-28 ENCOUNTER — Other Ambulatory Visit: Payer: BC Managed Care – PPO

## 2020-06-15 ENCOUNTER — Other Ambulatory Visit: Payer: Self-pay | Admitting: Family Medicine

## 2020-06-27 ENCOUNTER — Other Ambulatory Visit: Payer: Self-pay | Admitting: *Deleted

## 2020-06-27 ENCOUNTER — Encounter: Payer: Self-pay | Admitting: Cardiology

## 2020-06-27 ENCOUNTER — Telehealth (INDEPENDENT_AMBULATORY_CARE_PROVIDER_SITE_OTHER): Payer: BC Managed Care – PPO | Admitting: Cardiology

## 2020-06-27 ENCOUNTER — Telehealth: Payer: Self-pay | Admitting: *Deleted

## 2020-06-27 ENCOUNTER — Other Ambulatory Visit: Payer: Self-pay

## 2020-06-27 VITALS — BP 138/98 | HR 88 | Ht 72.0 in | Wt 172.0 lb

## 2020-06-27 DIAGNOSIS — R002 Palpitations: Secondary | ICD-10-CM

## 2020-06-27 DIAGNOSIS — I10 Essential (primary) hypertension: Secondary | ICD-10-CM

## 2020-06-27 DIAGNOSIS — I471 Supraventricular tachycardia: Secondary | ICD-10-CM

## 2020-06-27 DIAGNOSIS — Z79899 Other long term (current) drug therapy: Secondary | ICD-10-CM

## 2020-06-27 DIAGNOSIS — Z13220 Encounter for screening for lipoid disorders: Secondary | ICD-10-CM

## 2020-06-27 DIAGNOSIS — R7309 Other abnormal glucose: Secondary | ICD-10-CM

## 2020-06-27 MED ORDER — DILTIAZEM HCL ER COATED BEADS 240 MG PO CP24: 240.0000 mg | ORAL_CAPSULE | Freq: Every day | ORAL | 1 refills | Status: DC

## 2020-06-27 NOTE — Telephone Encounter (Signed)
  Patient Consent for Virtual Visit        [name] has provided verbal consent on 06/27/2020 for a virtual visit (video or telephone).   CONSENT FOR VIRTUAL VISIT FOR:  Leonard Armstrong  By participating in this virtual visit I agree to the following:  I hereby voluntarily request, consent and authorize [facility] and its employed or contracted physicians, physician assistants, nurse practitioners or other licensed health care professionals (the Practitioner), to provide me with telemedicine health care services (the "Services") as deemed necessary by the treating Practitioner. I acknowledge and consent to receive the Services by the Practitioner via telemedicine. I understand that the telemedicine visit will involve communicating with the Practitioner through live audiovisual communication technology and the disclosure of certain medical information by electronic transmission. I acknowledge that I have been given the opportunity to request an in-person assessment or other available alternative prior to the telemedicine visit and am voluntarily participating in the telemedicine visit.  I understand that I have the right to withhold or withdraw my consent to the use of telemedicine in the course of my care at any time, without affecting my right to future care or treatment, and that the Practitioner or I may terminate the telemedicine visit at any time. I understand that I have the right to inspect all information obtained and/or recorded in the course of the telemedicine visit and may receive copies of available information for a reasonable fee.  I understand that some of the potential risks of receiving the Services via telemedicine include:  Delay or interruption in medical evaluation due to technological equipment failure or disruption; Information transmitted may not be sufficient (e.g. poor resolution of images) to allow for appropriate medical decision making by the Practitioner; and/or   In rare instances, security protocols could fail, causing a breach of personal health information.  Furthermore, I acknowledge that it is my responsibility to provide information about my medical history, conditions and care that is complete and accurate to the best of my ability. I acknowledge that Practitioner's advice, recommendations, and/or decision may be based on factors not within their control, such as incomplete or inaccurate data provided by me or distortions of diagnostic images or specimens that may result from electronic transmissions. I understand that the practice of medicine is not an exact science and that Practitioner makes no warranties or guarantees regarding treatment outcomes. I acknowledge that a copy of this consent can be made available to me via my patient portal ([facility]), or I can request a printed copy by calling the office of [facility].    I understand that my insurance will be billed for this visit.   I have read or had this consent read to me. I understand the contents of this consent, which adequately explains the benefits and risks of the Services being provided via telemedicine.  I have been provided ample opportunity to ask questions regarding this consent and the Services and have had my questions answered to my satisfaction. I give my informed consent for the services to be provided through the use of telemedicine in my medical care

## 2020-06-27 NOTE — Progress Notes (Signed)
Virtual Visit via Telephone Note   This visit type was conducted due to national recommendations for restrictions regarding the COVID-19 Pandemic (e.g. social distancing) in an effort to limit this patient's exposure and mitigate transmission in our community.  Due to his co-morbid illnesses, this patient is at least at moderate risk for complications without adequate follow up.  This format is felt to be most appropriate for this patient at this time.  The patient did not have access to video technology/had technical difficulties with video requiring transitioning to audio format only (telephone).  All issues noted in this document were discussed and addressed.  No physical exam could be performed with this format.  Please refer to the patient's chart for his  consent to telehealth for [facility].    Date:  06/27/2020   ID:  Leonard Armstrong, DOB [birth year], MRN [id number] The patient was identified using 2 identifiers.  Patient Location: Home Provider Location: Office/Clinic   PCP:  Verta Ellen., NP   [facility] Providers Cardiologist:  Carlyle Dolly, MD     Evaluation Performed:  Follow-Up Visit  Chief Complaint:  Follow up  History of Present Illness:    Leonard Armstrong is a 65 y.o. male seen today for follow up of the following medical rpoblems.   1.PSVT - no recent palpitations - compliant with meds. He is on dilt 180mg  daily, has dilt short acting prn    2. HTN  Home BP today 138/90. States after last lab work his Spironolactone was discontinued d/t lab results. His Na+ had decreased to 129. His Crt. was 0.97 and GFR 97.  - compliant with meds   The patient does have symptoms concerning for COVID-19 infection (fever, chills, cough, or new shortness of breath). However had negative home test.    Past Medical History:  Diagnosis Date   Allergy    Back pain    Cancer ([place])    Skin   Hypertension    Loss of smell    Tachycardia    Past Surgical  History:  Procedure Laterality Date   BACK SURGERY     HERNIA REPAIR     VASECTOMY       No outpatient medications have been marked as taking for the 06/27/20 encounter (Appointment) with Arnoldo Lenis, MD.     Allergies:   Other, Prednisone, and Wasp venom   Social History   Tobacco Use   Smoking status: Every Day    Packs/day: 2.00    Pack years: 0.00    Types: Cigarettes    Start date: 01/20/1975   Smokeless tobacco: Former    Types: Chew  Substance Use Topics   Alcohol use: Yes    Alcohol/week: 42.0 standard drinks    Types: 42 Cans of beer per week   Drug use: No     Family Hx: The patient's family history includes Cancer in his father, mother, and another family member; Heart disease in an other family member; Hypertension in his father; Lung cancer in his mother.  ROS:   Please see the history of present illness.     All other systems reviewed and are negative.   Prior CV studies:   The following studies were reviewed today:  12/2013 echo Study Conclusions  - Left ventricle: The cavity size was normal. Wall thickness was   normal. Systolic function was normal. The estimated ejection   fraction was in the range of 60% to 65%. Wall motion was  normal;   there were no regional wall motion abnormalities. Left   ventricular diastolic function parameters were normal. - Technically adequate study.   Jan 2016 MPI IMPRESSION: 1. Diaphragmatic attenuation noted without clear evidence of scar or significant ischemia.   2. Normal left ventricular wall motion.   3. Left ventricular ejection fraction 69%   4. Low-risk stress test findings*.  05/2019 nuclear stress No diagnostic ST segment changes to indicate ischemia. Small, mild intensity, inferior/inferoseptal defect that is fixed and most consistent with soft tissue attenuation. No large ischemic territories. This is a low risk study. Nuclear stress EF: 79%.  Labs/Other Tests and Data Reviewed:     EKG:  No ECG reviewed.  Recent Labs: No results found for requested labs within last 8760 hours.   Recent Lipid Panel No results found for: CHOL, TRIG, HDL, CHOLHDL, LDLCALC, LDLDIRECT  Wt Readings from Last 3 Encounters:  12/16/19 171 lb (77.6 kg)  06/17/19 163 lb (73.9 kg)  05/25/19 166 lb 9.6 oz (75.6 kg)     Risk Assessment/Calculations:          Objective:    Vital Signs:   Today's Vitals   06/27/20 1021  BP: (!) 138/98  Pulse: 88  SpO2: 97%  Weight: 172 lb (78 kg)  Height: 6' (1.829 m)   Body mass index is 23.33 kg/m.  Normal affect. Normal speech pattern and tone. Comfortable, no apparent distress. No audible signs of sob or wheezing.   ASSESSMENT & PLAN:   1. PSVT -no symptoms, continue current meds. Increasing diltiazem to 240mg  daily for better bp control, from PSVT standpoint has done well.    2. HTN Above goal - some prior issues with low Na, would avoid higher dose diuretic - increasing diltiazem to 240mg  daily.          COVID-19 Education: The signs and symptoms of COVID-19 were discussed with the patient and how to seek care for testing (follow up with PCP or arrange E-visit).  The importance of social distancing was discussed today.  Time:   Today, I have spent 18 minutes with the patient with telehealth technology discussing the above problems.     Medication Adjustments/Labs and Tests Ordered: Current medicines are reviewed at length with the patient today.  Concerns regarding medicines are outlined above.   Tests Ordered: No orders of the defined types were placed in this encounter.   Medication Changes: No orders of the defined types were placed in this encounter.   Follow Up:  In Person 6 months  Signed, Carlyle Dolly, MD  06/27/2020 8:22 AM    [facility]

## 2020-06-27 NOTE — Patient Instructions (Addendum)
Medication Instructions:  Your physician has recommended you make the following change in your medication: Increase cardizem cd to 240 mg by mouth daily Continue other medications the same  Labwork: Your physician recommends that you return for a FASTING lab work as soon as possible at [facility]. Please do not eat or drink for at least 8 hours when you have this done. You may take your medications that morning with a sip of water.  Testing/Procedures: none  Follow-Up: Your physician recommends that you schedule a follow-up appointment in: 6 months.  Any Other Special Instructions Will Be Listed Below (If Applicable).  If you need a refill on your cardiac medications before your next appointment, please call your pharmacy.

## 2020-08-09 ENCOUNTER — Other Ambulatory Visit: Payer: Self-pay | Admitting: Cardiology

## 2020-09-16 ENCOUNTER — Other Ambulatory Visit: Payer: Self-pay | Admitting: Cardiology

## 2020-11-28 ENCOUNTER — Other Ambulatory Visit: Payer: Self-pay | Admitting: Cardiology

## 2020-11-28 DIAGNOSIS — Z79899 Other long term (current) drug therapy: Secondary | ICD-10-CM

## 2020-12-28 ENCOUNTER — Telehealth: Payer: Self-pay | Admitting: Cardiology

## 2020-12-28 ENCOUNTER — Other Ambulatory Visit: Payer: Self-pay

## 2020-12-28 DIAGNOSIS — I10 Essential (primary) hypertension: Secondary | ICD-10-CM

## 2020-12-28 DIAGNOSIS — I471 Supraventricular tachycardia: Secondary | ICD-10-CM

## 2020-12-28 DIAGNOSIS — Z131 Encounter for screening for diabetes mellitus: Secondary | ICD-10-CM

## 2020-12-28 DIAGNOSIS — Z13220 Encounter for screening for lipoid disorders: Secondary | ICD-10-CM

## 2020-12-28 NOTE — Telephone Encounter (Signed)
Original orders expired. Orders re-entered for labs and mailed to pt. Pt notified.

## 2020-12-28 NOTE — Telephone Encounter (Signed)
° °  Pt is requesting if nurse can mail him his labs order, he said he lost it

## 2021-01-01 ENCOUNTER — Ambulatory Visit: Payer: BC Managed Care – PPO | Admitting: Cardiology

## 2021-02-20 ENCOUNTER — Other Ambulatory Visit: Payer: Self-pay | Admitting: Cardiology

## 2021-02-21 LAB — CBC
Hematocrit: 44 % (ref 37.5–51.0)
Hemoglobin: 15.7 g/dL (ref 13.0–17.7)
MCH: 34.8 pg — ABNORMAL HIGH (ref 26.6–33.0)
MCHC: 35.7 g/dL (ref 31.5–35.7)
MCV: 98 fL — ABNORMAL HIGH (ref 79–97)
Platelets: 303 10*3/uL (ref 150–450)
RBC: 4.51 x10E6/uL (ref 4.14–5.80)
RDW: 11.4 % — ABNORMAL LOW (ref 11.6–15.4)
WBC: 9.7 10*3/uL (ref 3.4–10.8)

## 2021-02-21 LAB — COMPREHENSIVE METABOLIC PANEL
ALT: 15 IU/L (ref 0–44)
AST: 23 IU/L (ref 0–40)
Albumin/Globulin Ratio: 1.7 (ref 1.2–2.2)
Albumin: 4.8 g/dL (ref 3.8–4.8)
Alkaline Phosphatase: 102 IU/L (ref 44–121)
BUN/Creatinine Ratio: 15 (ref 10–24)
BUN: 11 mg/dL (ref 8–27)
Bilirubin Total: 0.5 mg/dL (ref 0.0–1.2)
CO2: 25 mmol/L (ref 20–29)
Calcium: 9.8 mg/dL (ref 8.6–10.2)
Chloride: 91 mmol/L — ABNORMAL LOW (ref 96–106)
Creatinine, Ser: 0.72 mg/dL — ABNORMAL LOW (ref 0.76–1.27)
Globulin, Total: 2.9 g/dL (ref 1.5–4.5)
Glucose: 108 mg/dL — ABNORMAL HIGH (ref 70–99)
Potassium: 4.2 mmol/L (ref 3.5–5.2)
Sodium: 131 mmol/L — ABNORMAL LOW (ref 134–144)
Total Protein: 7.7 g/dL (ref 6.0–8.5)
eGFR: 103 mL/min/{1.73_m2} (ref >59)

## 2021-02-21 LAB — LIPID PANEL
Chol/HDL Ratio: 1.7 ratio (ref 0.0–5.0)
Cholesterol, Total: 145 mg/dL (ref 100–199)
HDL: 84 mg/dL (ref >39)
LDL Chol Calc (NIH): 51 mg/dL (ref 0–99)
Triglycerides: 40 mg/dL (ref 0–149)
VLDL Cholesterol Cal: 10 mg/dL (ref 5–40)

## 2021-02-21 LAB — HEMOGLOBIN A1C
Est. average glucose Bld gHb Est-mCnc: 100 mg/dL
Hgb A1c MFr Bld: 5.1 % (ref 4.8–5.6)

## 2021-02-21 LAB — MAGNESIUM: Magnesium: 2 mg/dL (ref 1.6–2.3)

## 2021-02-21 LAB — TSH: TSH: 0.769 u[IU]/mL (ref 0.450–4.500)

## 2021-03-05 ENCOUNTER — Telehealth: Payer: Self-pay

## 2021-03-05 NOTE — Telephone Encounter (Signed)
-----   Message from Arnoldo Lenis, MD sent at 03/03/2021  9:39 AM EST ----- Normal labs   Zandra Abts MD

## 2021-03-05 NOTE — Telephone Encounter (Signed)
Patient notified and voiced understanding.Pt had no questions or concerns at this time.

## 2021-03-13 ENCOUNTER — Ambulatory Visit: Payer: BC Managed Care – PPO | Admitting: Cardiology

## 2021-03-13 ENCOUNTER — Encounter: Payer: Self-pay | Admitting: Cardiology

## 2021-03-13 VITALS — BP 144/70 | HR 105 | Ht 72.0 in | Wt 168.4 lb

## 2021-03-13 DIAGNOSIS — I471 Supraventricular tachycardia: Secondary | ICD-10-CM | POA: Diagnosis not present

## 2021-03-13 DIAGNOSIS — I10 Essential (primary) hypertension: Secondary | ICD-10-CM | POA: Diagnosis not present

## 2021-03-13 MED ORDER — DILTIAZEM HCL ER COATED BEADS 300 MG PO CP24: 300.0000 mg | ORAL_CAPSULE | Freq: Every day | ORAL | 1 refills | Status: DC

## 2021-03-13 NOTE — Progress Notes (Signed)
? ? ? ?Clinical Summary ?Leonard Armstrong is a 66 y.o.male seen today for follow up of the following medical rpoblems.  ?  ?1.PSVT ?- no recent palpitations ?- compliant with meds. He is on dilt '180mg'$  daily, has dilt short acting prn ?  ? ?- last visit we increased dilt to '240mg'$  daily ?- no recent symptoms ?  ? 2. HTN ? Home BP today 138/90. States after last lab work his Spironolactone was discontinued d/t lab results. His Na+ had decreased to 129. His Crt. was 0.97 and GFR 97. ?  ?- home bp's 130s-140s/70s-80s ?- compliant with meds ? ?  ?Past Medical History:  ?Diagnosis Date  ? Allergy   ? Back pain   ? Cancer [facility])   ? Skin  ? Hypertension   ? Loss of smell   ? Tachycardia   ? ? ? ?Allergies  ?Allergen Reactions  ? Other Other (See Comments)  ?  Steroids- not in right state of mind. Makes vital signs erratic.   ? Prednisone Other (See Comments)  ?  Not in right state of mind, makes vital signs erratic.   ? Wasp Venom Swelling  ? ? ? ?Current Outpatient Medications  ?Medication Sig Dispense Refill  ? acetaminophen (TYLENOL) 500 MG tablet Take 1,000 mg by mouth every 6 (six) hours as needed for mild pain.    ? chlorthalidone (HYGROTON) 25 MG tablet TAKE 1/2 TABLET BY MOUTH EVERY DAY 45 tablet 3  ? clonazePAM (KLONOPIN) 0.5 MG tablet Take one po qd prn anxiety 24 tablet 1  ? diltiazem (CARDIZEM CD) 240 MG 24 hr capsule TAKE 1 CAPSULE(240 MG) BY MOUTH DAILY 90 capsule 1  ? diltiazem (CARDIZEM) 60 MG tablet Take 1 tablet (60 mg total) by mouth daily as needed (for breakthrough palpiations). 90 tablet 1  ? losartan (COZAAR) 100 MG tablet TAKE 1 TABLET BY MOUTH EVERY NIGHT AT BEDTIME 90 tablet 2  ? sildenafil (VIAGRA) 100 MG tablet Take 100 mg by mouth as needed for erectile dysfunction.    ? ?No current facility-administered medications for this visit.  ? ? ? ?Past Surgical History:  ?Procedure Laterality Date  ? BACK SURGERY    ? HERNIA REPAIR    ? VASECTOMY    ? ? ? ?Allergies  ?Allergen Reactions  ? Other Other (See  Comments)  ?  Steroids- not in right state of mind. Makes vital signs erratic.   ? Prednisone Other (See Comments)  ?  Not in right state of mind, makes vital signs erratic.   ? Wasp Venom Swelling  ? ? ? ? ?Family History  ?Problem Relation Age of Onset  ? Lung cancer Mother   ? Cancer Mother   ?     lung  ? Hypertension Father   ? Cancer Father   ?     father  ? Heart disease Other   ? Cancer Other   ?     colon  ? ? ? ?Social History ?Leonard Armstrong reports that he has been smoking cigarettes. He started smoking about 46 years ago. He has been smoking an average of 2 packs per day. He has quit using smokeless tobacco.  His smokeless tobacco use included chew. ?Leonard Armstrong reports current alcohol use of about 42.0 standard drinks per week. ? ? ?Review of Systems ?CONSTITUTIONAL: No weight loss, fever, chills, weakness or fatigue.  ?HEENT: Eyes: No visual loss, blurred vision, double vision or yellow sclerae.No hearing loss, sneezing, congestion, runny nose or  sore throat.  ?SKIN: No rash or itching.  ?CARDIOVASCULAR: per hpi ?RESPIRATORY: No shortness of breath, cough or sputum.  ?GASTROINTESTINAL: No anorexia, nausea, vomiting or diarrhea. No abdominal pain or blood.  ?GENITOURINARY: No burning on urination, no polyuria ?NEUROLOGICAL: No headache, dizziness, syncope, paralysis, ataxia, numbness or tingling in the extremities. No change in bowel or bladder control.  ?MUSCULOSKELETAL: No muscle, back pain, joint pain or stiffness.  ?LYMPHATICS: No enlarged nodes. No history of splenectomy.  ?PSYCHIATRIC: No history of depression or anxiety.  ?ENDOCRINOLOGIC: No reports of sweating, cold or heat intolerance. No polyuria or polydipsia.  ?. ? ? ?Physical Examination ?Today's Vitals  ? 03/13/21 1401  ?BP: (!) 144/70  ?Pulse: (!) 105  ?SpO2: 98%  ?Weight: 168 lb 6.4 oz (76.4 kg)  ?Height: 6' (1.829 m)  ? ?Body mass index is 22.84 kg/m?. ? ?Gen: resting comfortably, no acute distress ?HEENT: no scleral icterus, pupils equal round  and reactive, no palptable cervical adenopathy,  ?CV: RRR, no m/r/g no jvd ?Resp: Clear to auscultation bilaterally ?GI: abdomen is soft, non-tender, non-distended, normal bowel sounds, no hepatosplenomegaly ?MSK: extremities are warm, no edema.  ?Skin: warm, no rash ?Neuro:  no focal deficits ?Psych: appropriate affect ? ? ?Diagnostic Studies ? ?12/2013 echo ?Study Conclusions ? ?- Left ventricle: The cavity size was normal. Wall thickness was ?  normal. Systolic function was normal. The estimated ejection ?  fraction was in the range of 60% to 65%. Wall motion was normal; ?  there were no regional wall motion abnormalities. Left ?  ventricular diastolic function parameters were normal. ?- Technically adequate study. ?  ?Jan 2016 MPI ?IMPRESSION: ?1. Diaphragmatic attenuation noted without clear evidence of scar or ?significant ischemia. ?  ?2. Normal left ventricular wall motion. ?  ?3. Left ventricular ejection fraction 69% ?  ?4. Low-risk stress test findings*. ?  ?05/2019 nuclear stress ?No diagnostic ST segment changes to indicate ischemia. ?Small, mild intensity, inferior/inferoseptal defect that is fixed and most consistent with soft tissue attenuation. No large ischemic territories. ?This is a low risk study. ?Nuclear stress EF: 79%. ? ? ?Assessment and Plan  ? ?1. PSVT ?- no recent symptoms, continue current meds ?  ?2. HTN ?-remains above goal. Prior low Na on aldactone, would also for that reason avoid increase chlorthalidone. Increase dilt to '300mg'$  daily. ? ? ? ? ?Arnoldo Lenis, M.D. ?

## 2021-03-13 NOTE — Patient Instructions (Signed)
Medication Instructions:  ?Your physician has recommended you make the following change in your medication:  ?Increase diltiazem to 300 mg once a day ?Continue other medications as directed ? ?Labwork: ?none ? ?Testing/Procedures: ?none ? ?Follow-Up: ?Your physician recommends that you schedule a follow-up appointment in: 1 year ? ?Any Other Special Instructions Will Be Listed Below (If Applicable). ?You will receive a reminder call in about 10 months reminding you to schedule your appointment. If you don't receive this call, please contact our office.  ? ?If you need a refill on your cardiac medications before your next appointment, please call your pharmacy. ? ?

## 2021-06-18 ENCOUNTER — Other Ambulatory Visit: Payer: Self-pay | Admitting: Cardiology

## 2021-07-15 ENCOUNTER — Ambulatory Visit
Admission: EM | Admit: 2021-07-15 | Discharge: 2021-07-15 | Disposition: A | Discharge status: Home / Self Care | Payer: BC Managed Care – PPO | Attending: Urgent Care | Admitting: Urgent Care

## 2021-07-15 ENCOUNTER — Ambulatory Visit (INDEPENDENT_AMBULATORY_CARE_PROVIDER_SITE_OTHER): Payer: BC Managed Care – PPO

## 2021-07-15 DIAGNOSIS — Z9889 Other specified postprocedural states: Secondary | ICD-10-CM | POA: Diagnosis not present

## 2021-07-15 DIAGNOSIS — M545 Low back pain, unspecified: Secondary | ICD-10-CM | POA: Diagnosis not present

## 2021-07-15 DIAGNOSIS — S32030A Wedge compression fracture of third lumbar vertebra, initial encounter for closed fracture: Secondary | ICD-10-CM

## 2021-07-15 MED ORDER — TIZANIDINE HCL 4 MG PO TABS: 4.0000 mg | ORAL_TABLET | Freq: Every day | ORAL | 0 refills | Status: DC

## 2021-07-15 MED ORDER — MELOXICAM 7.5 MG PO TABS: 7.5000 mg | ORAL_TABLET | Freq: Every day | ORAL | 0 refills | Status: DC

## 2021-07-15 NOTE — ED Provider Notes (Signed)
[place]   MRN: [id number] DOB: 02-Jun-1955  Subjective:   Leonard Armstrong is a 66 y.o. male presenting for 4-day history of acute onset persistent severe low back pain, tightness.  Symptoms started after he was trying to hook up a utility trailer.  Was not doing any excessive lifting but felt a sudden sharp pain in his sleep was extending his arms out to help with the tremor. Has a history of back surgery.  Has had intermittent flareups of his back pain.  He does not tolerate any kind of steroid well.  Has typically responded better using NSAIDs.  Has a history of SVT.  No history of heart disease, MI.  Patient is also a smoker.  No current facility-administered medications for this encounter.  Current Outpatient Medications:    acetaminophen (TYLENOL) 500 MG tablet, Take 1,000 mg by mouth every 6 (six) hours as needed for mild pain., Disp: , Rfl:    chlorthalidone (HYGROTON) 25 MG tablet, TAKE 1/2 TABLET BY MOUTH EVERY DAY, Disp: 45 tablet, Rfl: 3   diltiazem (CARDIZEM CD) 240 MG 24 hr capsule, TAKE 1 CAPSULE(240 MG) BY MOUTH DAILY, Disp: 90 capsule, Rfl: 1   diltiazem (CARDIZEM CD) 300 MG 24 hr capsule, Take 1 capsule (300 mg total) by mouth daily., Disp: 90 capsule, Rfl: 1   diltiazem (CARDIZEM) 60 MG tablet, Take 1 tablet (60 mg total) by mouth daily as needed (for breakthrough palpiations)., Disp: 90 tablet, Rfl: 1   losartan (COZAAR) 100 MG tablet, TAKE 1 TABLET BY MOUTH EVERY NIGHT AT BEDTIME, Disp: 90 tablet, Rfl: 2   sildenafil (VIAGRA) 100 MG tablet, Take 100 mg by mouth as needed for erectile dysfunction., Disp: , Rfl:    Allergies  Allergen Reactions   Other Other (See Comments)    Steroids- not in right state of mind. Makes vital signs erratic.    Prednisone Other (See Comments)    Not in right state of mind, makes vital signs erratic.    Wasp Venom Swelling    Past Medical History:  Diagnosis Date   Allergy    Back pain    Cancer ([place])    Skin    Hypertension    Loss of smell    Tachycardia      Past Surgical History:  Procedure Laterality Date   BACK SURGERY     HERNIA REPAIR     VASECTOMY      Family History  Problem Relation Age of Onset   Lung cancer Mother    Cancer Mother        lung   Hypertension Father    Cancer Father        father   Heart disease Other    Cancer Other        colon    Social History   Tobacco Use   Smoking status: Every Day    Packs/day: 2.00    Types: Cigarettes    Start date: 01/20/1975   Smokeless tobacco: Former    Types: Chew  Substance Use Topics   Alcohol use: Yes    Alcohol/week: 42.0 standard drinks of alcohol    Types: 42 Cans of beer per week   Drug use: No    ROS   Objective:   Vitals: BP (!) 146/88   Pulse 93   Temp 97.8 F (36.6 C)   Resp 20   SpO2 96%   Physical Exam Constitutional:      General: He is not  in acute distress.    Appearance: Normal appearance. He is well-developed and normal weight. He is not ill-appearing, toxic-appearing or diaphoretic.  HENT:     Head: Normocephalic and atraumatic.     Right Ear: External ear normal.     Left Ear: External ear normal.     Nose: Nose normal.     Mouth/Throat:     Pharynx: Oropharynx is clear.  Eyes:     General: No scleral icterus.       Right eye: No discharge.        Left eye: No discharge.     Extraocular Movements: Extraocular movements intact.  Cardiovascular:     Rate and Rhythm: Normal rate.  Pulmonary:     Effort: Pulmonary effort is normal.  Musculoskeletal:     Cervical back: Normal range of motion.     Lumbar back: No swelling, edema, deformity, signs of trauma, lacerations, spasms, tenderness or bony tenderness. Normal range of motion. Negative right straight leg raise test and negative left straight leg raise test. No scoliosis.  Neurological:     Mental Status: He is alert and oriented to person, place, and time.  Psychiatric:        Mood and Affect: Mood normal.         Behavior: Behavior normal.        Thought Content: Thought content normal.        Judgment: Judgment normal.     Assessment and Plan :   I have reviewed the PDMP during this encounter.  1. Acute bilateral low back pain without sciatica   2. Acute midline low back pain without sciatica   3. History of back surgery    X-ray over-read was pending at time of discharge, recommended follow up with only abnormal results. Otherwise will not call for negative over-read. Patient was in agreement.  Discussed use of NSAIDs in the context of his smoking.  Patient verbalizes the risk of thrombotic event from using NSAIDs and being a smoker.  He definitely does not want to use any kind of steroid.  Therefore advised that the next most appropriate step would be to use an NSAID.  I will avoid the use of narcotic pain medicines especially in the context of his history of alcohol abuse.  Recommended meloxicam, tizanidine.  Hydrate very well.  Discussed back care. Counseled patient on potential for adverse effects with medications prescribed/recommended today, ER and return-to-clinic precautions discussed, patient verbalized understanding.   UPDATE:   DG Lumbar Spine Complete  Result Date: 07/15/2021 CLINICAL DATA:  Low back pain after hooking utility trailer 3 days ago. EXAM: LUMBAR SPINE - COMPLETE 4+ VIEW COMPARISON:  None Available. FINDINGS: Moderate to severe compression fracture of the L3 vertebral body is seen, which is of indeterminate age. No other fractures identified. Severe degenerative disc disease and bilateral facet DJD is seen at L5-S1, with grade 2 anterolisthesis measuring 21 mm. Other intervertebral disc spaces are maintained. No focal lytic or sclerotic bone lesions identified. Aortic atherosclerotic calcification incidentally noted. IMPRESSION: Moderate to severe compression fracture of the L3 vertebral body, which is of indeterminate age. 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 Electronically Signed   By: Marlaine Hind M.D.   On: 07/15/2021 09:09    Patient has a history of this type of injury but I will attempt to discuss results with him to see if he can recall what kind of injury  he had in the 90s when he subsequently underwent surgery.   UPDATE:  1. Acute bilateral low back pain without sciatica   2. Acute midline low back pain without sciatica   3. History of back surgery   4. Compression fracture of L3 lumbar vertebra, closed, initial encounter [facility])     Discussed results with patient.  He has not previously been diagnosed with a compression fracture at L3.  The other findings are not new to the patient.  He does not have a back specialist currently.  Advised that he contact [place] [facility] for further management and consultation regarding his compression fracture. Counseled patient on potential for adverse effects with medications prescribed/recommended today, ER and return-to-clinic precautions discussed, patient verbalized understanding.    Jaynee Eagles, [place] 07/15/21 [phone]

## 2021-07-15 NOTE — ED Triage Notes (Signed)
Pt reports back surgery in 1994

## 2021-07-15 NOTE — ED Triage Notes (Signed)
Pt presents with c/o low back pain after hooking up utility trailer on Thursday

## 2021-07-27 ENCOUNTER — Ambulatory Visit (INDEPENDENT_AMBULATORY_CARE_PROVIDER_SITE_OTHER): Payer: BC Managed Care – PPO

## 2021-07-27 ENCOUNTER — Ambulatory Visit
Admission: EM | Admit: 2021-07-27 | Discharge: 2021-07-27 | Disposition: A | Discharge status: Home / Self Care | Payer: BC Managed Care – PPO | Attending: Nurse Practitioner | Admitting: Nurse Practitioner

## 2021-07-27 ENCOUNTER — Other Ambulatory Visit: Payer: Self-pay

## 2021-07-27 DIAGNOSIS — R0602 Shortness of breath: Secondary | ICD-10-CM

## 2021-07-27 DIAGNOSIS — R059 Cough, unspecified: Secondary | ICD-10-CM | POA: Diagnosis not present

## 2021-07-27 MED ORDER — LEVALBUTEROL TARTRATE 45 MCG/ACT IN AERO: 1.0000 | INHALATION_SPRAY | Freq: Four times a day (QID) | RESPIRATORY_TRACT | 1 refills | Status: DC | PRN

## 2021-07-27 MED ORDER — PROMETHAZINE-DM 6.25-15 MG/5ML PO SYRP: 5.0000 mL | ORAL_SOLUTION | Freq: Four times a day (QID) | ORAL | 0 refills | Status: DC | PRN

## 2021-07-27 NOTE — ED Triage Notes (Signed)
Pt reports cough, shortness of breath x 1 week; swelling legs and feet x 2-3 weeks. Pt reports the cough and shortness of breath is due to back injury. States he was not able t have a MRI done as he was not able to stay still, MRI was reschedule. Pt has appointment with new Pulmonologist and PCP in 6 weeks.

## 2021-07-27 NOTE — Discharge Instructions (Addendum)
Your x-rays are suggestive of COPD. Your EKG showed a first degree heart block, but otherwise normal.  Take medication as prescribed. As discussed, if you need to establish care with a PCP sooner, you can go to [email or web address] and find a doctor in this area.   Keep your appointment with pulmonology in August. Discussed smoking cessation with the patient and advised that this is something he may need to consider given his current symptoms.   Go to the emergency department if you develop worsening shortness of breath, difficulty breathing, or other concerns. Follow-up as needed.

## 2021-07-27 NOTE — ED Provider Notes (Addendum)
[facility]    CSN: [id number] Arrival date & time: 07/27/21  8768      History   Chief Complaint Chief Complaint  Patient presents with   Cough   Shortness of Breath    HPI Leonard Armstrong is a 66 y.o. male.   The history is provided by the patient and the spouse.    Patient presents for cough and shortness of breath for the past week.  He states his shortness of breath has worsened over the past several days.  He states that he does have pulmonary history.  Patient is a current 2 pack/day smoker, and has done so for "most of his adult life".  Patient is scheduled to see the pulmonologist, but appointment is not scheduled until the end of August.  He complains of chest tightness, but states this is more related to his back injury.  He is currently seeing orthopedics for his back injury and awaiting an MRI.  He denies fever, chills, chest pain, productive cough, abdominal pain, or GI symptoms.  He states that he also has a history of SVT.  States he was given an albuterol inhaler in the past and was told by his cardiologist that he could not use that medication.  Patient also states that he is allergic to prednisone which also causes problems for him with his breathing. Past Medical History:  Diagnosis Date   Allergy    Back pain    Cancer ([place])    Skin   Hypertension    Loss of smell    Tachycardia     Patient Active Problem List   Diagnosis Date Noted   Erectile dysfunction 04/25/2015   Metatarsalgia of right foot 04/25/2015   ETOH abuse 12/26/2013   Sustained SVT (HCC)    AVNRT (AV nodal re-entry tachycardia) ([place])    Tachycardia 12/25/2013   Nicotine dependence 12/25/2013   HTN (hypertension) 06/08/2012   Allergic rhinitis 06/08/2012    Past Surgical History:  Procedure Laterality Date   BACK SURGERY     HERNIA REPAIR     VASECTOMY         Home Medications    Prior to Admission medications   Medication Sig Start Date End Date Taking?  Authorizing Provider  levalbuterol [facility] HFA) 45 MCG/ACT inhaler Inhale 1-2 puffs into the lungs every 6 (six) hours as needed for wheezing or shortness of breath. 07/27/21  Yes Julianna Vanwagner-Warren, Alda Lea, NP  promethazine-dextromethorphan (PROMETHAZINE-DM) 6.25-15 MG/5ML syrup Take 5 mLs by mouth 4 (four) times daily as needed for cough. 07/27/21  Yes Dare Spillman-Warren, Alda Lea, NP  acetaminophen (TYLENOL) 500 MG tablet Take 1,000 mg by mouth every 6 (six) hours as needed for mild pain.    [provider]  chlorthalidone (HYGROTON) 25 MG tablet TAKE 1/2 TABLET BY MOUTH EVERY DAY 08/09/20   Arnoldo Lenis, MD  diltiazem (CARDIZEM CD) 240 MG 24 hr capsule TAKE 1 CAPSULE(240 MG) BY MOUTH DAILY 11/28/20   Arnoldo Lenis, MD  diltiazem (CARDIZEM CD) 300 MG 24 hr capsule Take 1 capsule (300 mg total) by mouth daily. 03/13/21   Arnoldo Lenis, MD  diltiazem (CARDIZEM) 60 MG tablet Take 1 tablet (60 mg total) by mouth daily as needed (for breakthrough palpiations). 05/25/19   Verta Ellen., NP  losartan (COZAAR) 100 MG tablet TAKE 1 TABLET BY MOUTH EVERY NIGHT AT BEDTIME 06/18/21   Arnoldo Lenis, MD  meloxicam (MOBIC) 7.5 MG tablet Take 1 tablet (  7.5 mg total) by mouth daily. 07/15/21   Jaynee Eagles, PA-C  sildenafil (VIAGRA) 100 MG tablet Take 100 mg by mouth as needed for erectile dysfunction.    [provider]  tiZANidine (ZANAFLEX) 4 MG tablet Take 1 tablet (4 mg total) by mouth at bedtime. 07/15/21   Jaynee Eagles, PA-C    Family History Family History  Problem Relation Age of Onset   Lung cancer Mother    Cancer Mother        lung   Hypertension Father    Cancer Father        father   Heart disease Other    Cancer Other        colon    Social History Social History   Tobacco Use   Smoking status: Every Day    Packs/day: 2.00    Types: Cigarettes    Start date: 01/20/1975   Smokeless tobacco: Former    Types: Chew  Substance Use Topics   Alcohol use:  Yes    Alcohol/week: 42.0 standard drinks of alcohol    Types: 42 Cans of beer per week   Drug use: No     Allergies   Wasp venom, Other, and Prednisone   Review of Systems Review of Systems Per HPI  Physical Exam Triage Vital Signs ED Triage Vitals  Enc Vitals Group     BP 07/27/21 1015 (!) 166/87     Pulse Rate 07/27/21 1015 91     Resp 07/27/21 1015 18     Temp 07/27/21 1015 98.2 F (36.8 C)     Temp Source 07/27/21 1015 Oral     SpO2 07/27/21 1015 95 %     Weight --      Height --      Head Circumference --      Peak Flow --      Pain Score 07/27/21 1013 3     Pain Loc --      Pain Edu? --      Excl. in [place]? --    No data found.  Updated Vital Signs BP (!) 166/87 (BP Location: Right Arm)   Pulse 91   Temp 98.2 F (36.8 C) (Oral)   Resp 18   SpO2 95%   Visual Acuity Right Eye Distance:   Left Eye Distance:   Bilateral Distance:    Right Eye Near:   Left Eye Near:    Bilateral Near:     Physical Exam Vitals and nursing note reviewed.  Constitutional:      Appearance: He is well-developed.  HENT:     Head: Normocephalic.     Nose: Nose normal.  Eyes:     Extraocular Movements: Extraocular movements intact.     Pupils: Pupils are equal, round, and reactive to light.  Cardiovascular:     Rate and Rhythm: Normal rate and regular rhythm.  Pulmonary:     Effort: Pulmonary effort is normal. No accessory muscle usage or respiratory distress.     Breath sounds: No wheezing, rhonchi or rales.     Comments: Tripoding and pursed lip breathing noted on exam Abdominal:     General: Bowel sounds are normal.     Palpations: Abdomen is soft.     Tenderness: There is no abdominal tenderness.  Musculoskeletal:     Cervical back: Normal range of motion.  Skin:    General: Skin is warm and dry.  Neurological:     General: No focal deficit present.  Mental Status: He is alert and oriented to person, place, and time.  Psychiatric:        Mood and Affect:  Mood normal.        Behavior: Behavior normal.      UC Treatments / Results  Labs (all labs ordered are listed, but only abnormal results are displayed) Labs Reviewed - No data to display  EKG: NSR with 1st degree AV block   Radiology DG Chest 2 View  Result Date: 07/27/2021 CLINICAL DATA:  Shortness of breath and cough 1 week. EXAM: CHEST - 2 VIEW COMPARISON:  09/14/2016 FINDINGS: Lungs are hyperexpanded without focal airspace consolidation or effusion. Flattening of the hemidiaphragms on the lateral film. Cardiomediastinal silhouette and remainder of the exam is unchanged. IMPRESSION: 1.  No active cardiopulmonary disease. 2.  Findings suggesting COPD. Electronically Signed   By: Marin Olp M.D.   On: 07/27/2021 10:45    Procedures Procedures (including critical care time)  Medications Ordered in UC Medications - No data to display  Initial Impression / Assessment and Plan / UC Course  I have reviewed the triage vital signs and the nursing notes.  Pertinent labs & imaging results that were available during my care of the patient were reviewed by me and considered in my medical decision making (see chart for details).  Patient presents with cough and shortness of breath that is worsened over the past week.  Patient is currently a 2 pack/day smoker and has been for quite some time.  On exam, his vital signs are stable, he is hypertensive, but lung sounds are clear throughout.  He is in no acute distress.  We will start patient on Xopenex for his breathing and promethazine to help with his cough.  Discussion with patient regarding the use of [email or web address] to help him find a doctor more quickly so he can begin establishing primary care.  Patient was advised to keep his appointment with pulmonology.  Strict indications of when to go to the emergency department were provided to the patient.  Patient verbalizes understanding.  All questions were answered. Final Clinical Impressions(s)  / UC Diagnoses   Final diagnoses:  Cough, unspecified type  Shortness of breath     Discharge Instructions      Your x-rays are suggestive of COPD. Your EKG showed a first degree heart block, but otherwise normal.  Take medication as prescribed. As discussed, if you need to establish care with a PCP sooner, you can go to [email or web address] and find a doctor in this area.   Keep your appointment with pulmonology in August. Go to the emergency department if you develop worsening shortness of breath, difficulty breathing, or other concerns. Follow-up as needed.     ED Prescriptions     Medication Sig Dispense Auth. Provider   promethazine-dextromethorphan (PROMETHAZINE-DM) 6.25-15 MG/5ML syrup Take 5 mLs by mouth 4 (four) times daily as needed for cough. 118 mL Janecia Palau-Warren, Alda Lea, NP   levalbuterol [facility] HFA) 45 MCG/ACT inhaler Inhale 1-2 puffs into the lungs every 6 (six) hours as needed for wheezing or shortness of breath. 1 each Nova Evett-Warren, Alda Lea, NP      PDMP not reviewed this encounter.   Tish Men, NP 07/27/21 1106    Dajon Rowe-Warren, Alda Lea, NP 07/27/21 1106

## 2021-08-04 ENCOUNTER — Telehealth: Payer: Self-pay | Admitting: Family Medicine

## 2021-08-04 NOTE — Telephone Encounter (Signed)
Current Recently his daughter-in-law inquired about when Dr. Lacinda Axon would be able to see this gentleman as a new patient This patient used to come here with Dr. Richardson Landry  Please make sure that the patient is aware of his appointment on August 8.  If he has pressing needs that need to be addressed before the please let me know otherwise August 8 with Dr. Lacinda Axon

## 2021-08-05 ENCOUNTER — Telehealth: Payer: Self-pay | Admitting: Family Medicine

## 2021-08-05 NOTE — Telephone Encounter (Signed)
Left message on voice mail for appointment 8/1 @ 2:30 to address shortness of breath

## 2021-08-05 NOTE — Telephone Encounter (Signed)
Nurses I spoke with his daughter-in-law Leonard Armstrong She is requesting for him to be seen this week Please call the patient See previous message He has an appointment with Dr. Lacinda Axon next week We are willing to see him tomorrow afternoon to address his shortness of breath then his follow-up visits would be with Dr. Lacinda Axon Please talk with him if he would like to go ahead with this I could see him tomorrow afternoon thank you

## 2021-08-06 ENCOUNTER — Encounter: Payer: Self-pay | Admitting: Family Medicine

## 2021-08-06 ENCOUNTER — Ambulatory Visit: Payer: BC Managed Care – PPO | Admitting: Family Medicine

## 2021-08-06 VITALS — BP 126/68 | HR 88 | Temp 98.2°F | Ht 72.0 in | Wt 162.0 lb

## 2021-08-06 DIAGNOSIS — Z23 Encounter for immunization: Secondary | ICD-10-CM | POA: Diagnosis not present

## 2021-08-06 DIAGNOSIS — J439 Emphysema, unspecified: Secondary | ICD-10-CM

## 2021-08-06 NOTE — Progress Notes (Unsigned)
   Subjective:    Patient ID: Leonard Armstrong, male    DOB: 10/30/1955, 66 y.o.   MRN: [id number]  HPI Pt arrives with shortness of breath. Ongoing issues but has worsened over the past 3 weeks. Pt had back injury on 07/11/21 and believes that may be contributing.  Long history of smoking Had previous urgent care visit because he was having back injury and back pain.  Since then he states it is harder to get his breath.  Denies any swelling in the legs.  Denies PND Had x-ray which showed significant COPD O2 saturation good Patient does state that if he walks the length of his house he gets out of breath that he has noticed this as a gradual decline over the past year Review of Systems     Objective:   Physical Exam General-in no acute distress Eyes-no discharge Lungs-respiratory rate normal, CTA CV-no murmurs,RRR Extremities skin warm dry no edema Neuro grossly normal Behavior normal, alert  No respiratory distress currently O2 sat good blood pressure good      Assessment & Plan:  1. Pulmonary emphysema, unspecified emphysema type ([place]) He has COPD We will try to get a pulmonary function test either through the hospital or through pulmonologist He has an appointment with pulmonary at the end of August.  If they are unable to do a pulmonary function test before that visit then we will do it through the hospital We will stay away from long-acting bronchodilator for now because of his underlying history of PSVT  He does have sensitivity to albuterol but he states he is doing okay with Xopenex Finally consideration for Spiriva or similar  Patient was counseled to quit smoking.  He has tried Chantix and Wellbutrin in the past without success He states both of those medicines he did not like how they made him feel He does state that patches seem to help but he cannot tolerate using utilizing the patches at nighttime because they keep him awake  We also discussed CT scan for lung  cancer prevention.  I did educate the patient regarding how lung cancer screening can reduce her risk of dying from lung cancer by approximately 20% but also has significant potential risk for false alarms and for follow-up testing.  Gave him printed information he will read over this and then discuss more with Dr. Lacinda Axon.  2. Need for vaccination Today - Pneumococcal conjugate vaccine 20-valent (Prevnar 20)  We will try to get the pulmonary function test as quickly as possible either through [facility] or through the hospital

## 2021-08-07 DIAGNOSIS — J439 Emphysema, unspecified: Secondary | ICD-10-CM | POA: Insufficient documentation

## 2021-08-07 DIAGNOSIS — J449 Chronic obstructive pulmonary disease, unspecified: Secondary | ICD-10-CM | POA: Insufficient documentation

## 2021-08-09 ENCOUNTER — Other Ambulatory Visit: Payer: Self-pay | Admitting: *Deleted

## 2021-08-09 MED ORDER — SPIRIVA RESPIMAT 2.5 MCG/ACT IN AERS: 2.0000 | INHALATION_SPRAY | Freq: Every day | RESPIRATORY_TRACT | 1 refills | Status: DC

## 2021-08-11 ENCOUNTER — Other Ambulatory Visit: Payer: Self-pay | Admitting: Cardiology

## 2021-08-13 ENCOUNTER — Ambulatory Visit: Payer: BC Managed Care – PPO | Admitting: Family Medicine

## 2021-08-13 ENCOUNTER — Encounter: Payer: Self-pay | Admitting: Family Medicine

## 2021-08-13 DIAGNOSIS — Z720 Tobacco use: Secondary | ICD-10-CM

## 2021-08-13 DIAGNOSIS — I10 Essential (primary) hypertension: Secondary | ICD-10-CM | POA: Diagnosis not present

## 2021-08-13 DIAGNOSIS — I471 Supraventricular tachycardia, unspecified: Secondary | ICD-10-CM

## 2021-08-13 DIAGNOSIS — F1010 Alcohol abuse, uncomplicated: Secondary | ICD-10-CM

## 2021-08-13 DIAGNOSIS — Z0000 Encounter for general adult medical examination without abnormal findings: Secondary | ICD-10-CM

## 2021-08-13 DIAGNOSIS — J439 Emphysema, unspecified: Secondary | ICD-10-CM

## 2021-08-13 NOTE — Patient Instructions (Signed)
Consider CT lung cancer screening and colonoscopy.  Cut back on the smoking.  Follow up in 6 months.  Call or message with concerns.  Take care  Dr. Lacinda Axon

## 2021-08-14 DIAGNOSIS — Z0000 Encounter for general adult medical examination without abnormal findings: Secondary | ICD-10-CM | POA: Insufficient documentation

## 2021-08-14 DIAGNOSIS — F1010 Alcohol abuse, uncomplicated: Secondary | ICD-10-CM | POA: Insufficient documentation

## 2021-08-14 DIAGNOSIS — Z720 Tobacco use: Secondary | ICD-10-CM | POA: Insufficient documentation

## 2021-08-14 NOTE — Progress Notes (Signed)
Subjective:  Patient ID: Leonard Armstrong, male    DOB: 1955-02-28  Age: 66 y.o. MRN: [id number]  CC: Chief Complaint  Patient presents with   Establish Care    Pt seen Dr.Scott on 08/06/21 for emphysema. Pt started on Sprivia and has noticed some difference.      HPI:  66 year old male presents to establish care with me.  Emphysema/COPD Patient reports that he seems to be doing better on Spiriva.  Has upcoming appointment with pulmonology at the end of the month.  Continues to smoke.  Smokes approximately 1.5 packs/day.  Has been smoking for at least 45 years.  Discussed CT lung cancer screening today.  Low back pain Patient currently being followed by [facility].  Reports that he has a compression fracture in his low back.  Paroxysmal SVT No recent occurrences.  Stable on diltiazem.  Hypertension BP stable on losartan diltiazem and chlorthalidone.  Alcohol abuse Patient currently drinking 10-12 beers a day.  Patient Active Problem List   Diagnosis Date Noted   Alcohol abuse 08/14/2021   Tobacco abuse 08/14/2021   Preventative health care 08/14/2021   PSVT (paroxysmal supraventricular tachycardia) ([place]) 08/13/2021   Pulmonary emphysema ([place]) 08/07/2021   HTN (hypertension) 06/08/2012    Social Hx   Social History   Socioeconomic History   Marital status: Married    Spouse name: Not on file   Number of children: Not on file   Years of education: Not on file   Highest education level: Not on file  Occupational History   Not on file  Tobacco Use   Smoking status: Every Day    Packs/day: 2.00    Types: Cigarettes    Start date: 01/20/1975   Smokeless tobacco: Former    Types: Chew  Substance and Sexual Activity   Alcohol use: Yes    Alcohol/week: 42.0 standard drinks of alcohol    Types: 42 Cans of beer per week   Drug use: No   Sexual activity: Yes    Partners: Female  Other Topics Concern   Not on file  Social History Narrative   Not on file   Social  Determinants of Health   Financial Resource Strain: Not on file  Food Insecurity: Not on file  Transportation Needs: Not on file  Physical Activity: Not on file  Stress: Not on file  Social Connections: Not on file    Review of Systems  Constitutional: Negative.   Respiratory:  Positive for shortness of breath.   Musculoskeletal:  Positive for back pain.   Objective:  BP 136/78   Pulse 91   Temp 98.3 F (36.8 C)   Ht 5' 7.5" (1.715 m)   Wt 158 lb 9.6 oz (71.9 kg)   SpO2 99%   BMI 24.47 kg/m      08/13/2021    1:53 PM 08/06/2021    2:54 PM 08/06/2021    2:27 PM  BP/Weight  Systolic BP 539 767 341  Diastolic BP 78 68 78  Wt. (Lbs) 158.6  162  BMI 24.47 kg/m2  21.97 kg/m2    Physical Exam Vitals and nursing note reviewed.  Constitutional:      General: He is not in acute distress.    Appearance: Normal appearance.  HENT:     Head: Normocephalic and atraumatic.  Eyes:     General:        Right eye: No discharge.        Left eye: No discharge.  Conjunctiva/sclera: Conjunctivae normal.  Cardiovascular:     Rate and Rhythm: Normal rate and regular rhythm.  Pulmonary:     Effort: Pulmonary effort is normal.     Breath sounds: Normal breath sounds. No wheezing, rhonchi or rales.  Neurological:     Mental Status: He is alert.  Psychiatric:        Mood and Affect: Mood normal.        Behavior: Behavior normal.     Lab Results  Component Value Date   WBC 9.7 02/20/2021   HGB 15.7 02/20/2021   HCT 44.0 02/20/2021   PLT 303 02/20/2021   GLUCOSE 108 (H) 02/20/2021   CHOL 145 02/20/2021   TRIG 40 02/20/2021   HDL 84 02/20/2021   LDLCALC 51 02/20/2021   ALT 15 02/20/2021   AST 23 02/20/2021   NA 131 (L) 02/20/2021   K 4.2 02/20/2021   CL 91 (L) 02/20/2021   CREATININE 0.72 (L) 02/20/2021   BUN 11 02/20/2021   CO2 25 02/20/2021   TSH 0.769 02/20/2021   HGBA1C 5.1 02/20/2021     Assessment & Plan:   Problem List Items Addressed This Visit        Cardiovascular and Mediastinum   HTN (hypertension)    Stable. Continue current medications.      PSVT (paroxysmal supraventricular tachycardia) (HCC)    Stable. Continue Diltiazem.        Respiratory   Pulmonary emphysema (HCC)    Stable on Spiriva.        Other   Alcohol abuse    Advised to cut back slowly.       Preventative health care    Discussed CT lung cancer screening.  Discussed need for colonoscopy.  Patient wants to wait on HIV and hepatitis C screening.      Tobacco abuse    Discussed medications to help with smoking cessation.  Patient wants to hold off on medication at this time.  He wants to work on cutting back himself.      Follow-up:  Return in about 6 months (around 02/13/2022).  [place]

## 2021-08-14 NOTE — Assessment & Plan Note (Signed)
Stable on Spiriva.

## 2021-08-14 NOTE — Assessment & Plan Note (Signed)
Stable.  Continue current medications.

## 2021-08-14 NOTE — Assessment & Plan Note (Signed)
Discussed CT lung cancer screening.  Discussed need for colonoscopy.  Patient wants to wait on HIV and hepatitis C screening.

## 2021-08-14 NOTE — Assessment & Plan Note (Signed)
Stable. Continue Diltiazem 

## 2021-08-14 NOTE — Assessment & Plan Note (Signed)
Discussed medications to help with smoking cessation.  Patient wants to hold off on medication at this time.  He wants to work on cutting back himself.

## 2021-08-14 NOTE — Assessment & Plan Note (Signed)
Advised to cut back slowly.

## 2021-08-15 ENCOUNTER — Telehealth: Payer: Self-pay | Admitting: *Deleted

## 2021-08-15 NOTE — Telephone Encounter (Signed)
Patient called and stated he has thought about it and would like to proceed with the referral talked about for neurosurgery

## 2021-08-18 ENCOUNTER — Other Ambulatory Visit: Payer: Self-pay | Admitting: Family Medicine

## 2021-08-18 DIAGNOSIS — S32000S Wedge compression fracture of unspecified lumbar vertebra, sequela: Secondary | ICD-10-CM

## 2021-08-19 NOTE — Telephone Encounter (Signed)
Cook, Jayce G, DO   Referral placed. 

## 2021-09-02 ENCOUNTER — Ambulatory Visit: Payer: BC Managed Care – PPO | Admitting: Pulmonary Disease

## 2021-09-02 ENCOUNTER — Encounter: Payer: Self-pay | Admitting: Pulmonary Disease

## 2021-09-02 VITALS — BP 130/70 | HR 94 | Ht 72.0 in | Wt 159.0 lb

## 2021-09-02 DIAGNOSIS — J432 Centrilobular emphysema: Secondary | ICD-10-CM | POA: Diagnosis not present

## 2021-09-02 MED ORDER — LEVALBUTEROL TARTRATE 45 MCG/ACT IN AERO
1.0000 | INHALATION_SPRAY | Freq: Four times a day (QID) | RESPIRATORY_TRACT | 5 refills | Status: DC | PRN
Start: 2021-09-02 — End: 2021-10-01

## 2021-09-02 MED ORDER — SPIRIVA RESPIMAT 2.5 MCG/ACT IN AERS: 2.0000 | INHALATION_SPRAY | Freq: Every day | RESPIRATORY_TRACT | 5 refills | Status: DC

## 2021-09-02 NOTE — Patient Instructions (Signed)
Lab tests today  Will schedule pulmonary function test at [facility]  Call to schedule lung cancer screening if you decide to set this up  Follow up in 8 to 10 weeks in [place] office

## 2021-09-02 NOTE — Progress Notes (Signed)
[facility]  Chief Complaint  Patient presents with   Consult    Consult: COPD    Past Surgical History:  He  has a past surgical history that includes Back surgery; Vasectomy; and Hernia repair.  Past Medical History:  Allergies, Back pain, HTN, Tachycardia  Constitutional:  BP 130/70 (BP Location: Left Arm)   Pulse 94   Ht 6' (1.829 m)   Wt 159 lb (72.1 kg)   SpO2 97%   BMI 21.56 kg/m   Brief Summary:  Leonard Armstrong is a 66 y.o. male smoker with emphysema      Subjective:   He is here with his wife.  He grew up in [place].  He worked for [employer].  He had exposure to chemicals and fumes.  He would need to wear PPE sometimes at work, and used to get regular breathing tests at work.  He smoked up to 2 ppd, but more recently is smoking 1.5 ppd.  No history of pneumonia or TB.  He has several relatives who had emphysema and/or lung cancer.  He gets winded walking about 150 ft.  He has a dry cough.  No wheeze.  Denies chest pain or leg swelling.  Sleeps okay.    He was recently started on spiriva and xopenex.  These have helped.  He was concerned about use of albuterol because of tachycardia history.  Chest xray from July showed hyperinflation.  Physical Exam:   Appearance - well kempt   ENMT - no sinus tenderness, no oral exudate, no LAN, Mallampati 2 airway, no stridor  Respiratory - decreased breath sounds bilaterally, no wheezing or rales  CV - s1s2 regular rate and rhythm, no murmurs  Ext - no clubbing, no edema  Skin - no rashes  Psych - normal mood and affect   Pulmonary testing:    Chest Imaging:    Cardiac Tests:  Echo 12/26/13 >> EF 60 to 65%  Social History:  He  reports that he has been smoking cigarettes. He started smoking about 46 years ago. He has been smoking an average of 2 packs per day. He has quit using smokeless tobacco.  His smokeless tobacco use included chew. He reports  current alcohol use of about 42.0 standard drinks of alcohol per week. He reports that he does not use drugs.  Family History:  His family history includes Cancer in his father, mother, and another family member; Heart disease in an other family member; Hypertension in his father; Lung cancer in his mother.     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 - continue spiriva and xopenex - he has concerns about using albuterol or LABAs given history of PSVT - he has concerns about using steroid medications due to hypertension - discussed importance of maintaining his vaccination schedule - will arrange for PFT and alpha 1 testing  Tobacco abuse. - discussed importance of smoking cessation - he will think about lung cancer screening program  PSVT, Hypertension. - followed by Dr. Kerry Hough with cardiology  Time Spent Involved in Patient Care on Day of Examination:  51 minutes  Follow up:   Patient Instructions  Lab tests today  Will schedule pulmonary function test at [facility]  Call to schedule lung cancer screening if you decide to set this up  Follow up in 8 to 10 weeks in [place] office  Medication List:   Allergies as of 09/02/2021  Reactions   Wasp Venom Swelling   Other Other (See Comments)   Steroids- not in right state of mind. Makes vital signs erratic.    Prednisone Other (See Comments)   Not in right state of mind, makes vital signs erratic.         Medication List        Accurate as of September 02, 2021  3:36 PM. If you have any questions, ask your nurse or doctor.          STOP taking these medications    albuterol 108 (90 Base) MCG/ACT inhaler Commonly known as: VENTOLIN HFA Stopped by: Chesley Mires, MD       TAKE these medications    acetaminophen 500 MG tablet Commonly known as: TYLENOL Take 1,000 mg by mouth every 6 (six) hours as needed for mild pain.   chlorthalidone 25 MG tablet Commonly known as:  HYGROTON TAKE 1/2 TABLET BY MOUTH EVERY DAY   diltiazem 300 MG 24 hr capsule Commonly known as: Cardizem CD Take 1 capsule (300 mg total) by mouth daily.   diltiazem 60 MG tablet Commonly known as: CARDIZEM Take 1 tablet (60 mg total) by mouth daily as needed (for breakthrough palpiations).   levalbuterol 45 MCG/ACT inhaler Commonly known as: Xopenex HFA Inhale 1-2 puffs into the lungs every 6 (six) hours as needed for wheezing or shortness of breath.   losartan 100 MG tablet Commonly known as: COZAAR TAKE 1 TABLET BY MOUTH EVERY NIGHT AT BEDTIME   meloxicam 7.5 MG tablet Commonly known as: Mobic Take 1 tablet (7.5 mg total) by mouth daily.   promethazine-dextromethorphan 6.25-15 MG/5ML syrup Commonly known as: PROMETHAZINE-DM Take 5 mLs by mouth 4 (four) times daily as needed for cough.   sildenafil 100 MG tablet Commonly known as: VIAGRA Take 100 mg by mouth as needed for erectile dysfunction.   Spiriva Respimat 2.5 MCG/ACT Aers Generic drug: Tiotropium Bromide Monohydrate Inhale 2 puffs into the lungs daily.   tiZANidine 4 MG tablet Commonly known as: Zanaflex Take 1 tablet (4 mg total) by mouth at bedtime.        Signature:  Chesley Mires, MD [place] Pager - [phone] 09/02/2021, 3:36 PM

## 2021-09-05 LAB — ALPHA-1-ANTITRYPSIN PHENOTYP: A-1 Antitrypsin: 91 mg/dL — ABNORMAL LOW (ref 101–187)

## 2021-09-10 ENCOUNTER — Encounter: Payer: Self-pay | Admitting: Emergency Medicine

## 2021-09-10 ENCOUNTER — Ambulatory Visit
Admission: EM | Admit: 2021-09-10 | Discharge: 2021-09-10 | Disposition: A | Discharge status: Home / Self Care | Payer: BC Managed Care – PPO | Attending: Family Medicine | Admitting: Family Medicine

## 2021-09-10 DIAGNOSIS — H6983 Other specified disorders of Eustachian tube, bilateral: Secondary | ICD-10-CM | POA: Diagnosis not present

## 2021-09-10 DIAGNOSIS — H66001 Acute suppurative otitis media without spontaneous rupture of ear drum, right ear: Secondary | ICD-10-CM

## 2021-09-10 MED ORDER — AMOXICILLIN 875 MG PO TABS: 875.0000 mg | ORAL_TABLET | Freq: Two times a day (BID) | ORAL | 0 refills | Status: DC

## 2021-09-10 NOTE — ED Triage Notes (Signed)
Right ear pain x 1.5 weeks.  Hurt worse yesterday.

## 2021-09-10 NOTE — Discharge Instructions (Signed)
Try nasal sprays twice daily, decongestant such as Coricidin HBP and take your full course of antibiotics.  Return for worsening symptoms.

## 2021-09-10 NOTE — ED Provider Notes (Signed)
[facility]    CSN: [id number] Arrival date & time: 09/10/21  0807      History   Chief Complaint No chief complaint on file.   HPI Leonard Armstrong is a 66 y.o. male.   Presenting today with 1.5-week history of right ear pain, pressure, popping.  Denies fever, chills, drainage, headache, cough, sore throat, nasal congestion.  Trying Tylenol with minimal relief.  States symptoms significantly worsen the past few days.    Past Medical History:  Diagnosis Date   Allergy    Back pain    Cancer ([place])    Skin   Hypertension    Loss of smell    Tachycardia     Patient Active Problem List   Diagnosis Date Noted   Alcohol abuse 08/14/2021   Tobacco abuse 08/14/2021   Preventative health care 08/14/2021   PSVT (paroxysmal supraventricular tachycardia) ([place]) 08/13/2021   Pulmonary emphysema ([place]) 08/07/2021   HTN (hypertension) 06/08/2012    Past Surgical History:  Procedure Laterality Date   BACK SURGERY     HERNIA REPAIR     VASECTOMY         Home Medications    Prior to Admission medications   Medication Sig Start Date End Date Taking? Authorizing Provider  amoxicillin (AMOXIL) 875 MG tablet Take 1 tablet (875 mg total) by mouth 2 (two) times daily. 09/10/21  Yes Volney American, PA-C  acetaminophen (TYLENOL) 500 MG tablet Take 1,000 mg by mouth every 6 (six) hours as needed for mild pain.    [provider]  chlorthalidone (HYGROTON) 25 MG tablet TAKE 1/2 TABLET BY MOUTH EVERY DAY 08/12/21   Arnoldo Lenis, MD  diltiazem (CARDIZEM CD) 300 MG 24 hr capsule Take 1 capsule (300 mg total) by mouth daily. 03/13/21   Arnoldo Lenis, MD  diltiazem (CARDIZEM) 60 MG tablet Take 1 tablet (60 mg total) by mouth daily as needed (for breakthrough palpiations). 05/25/19   Verta Ellen., NP  levalbuterol [facility] HFA) 45 MCG/ACT inhaler Inhale 1-2 puffs into the lungs every 6 (six) hours as needed for wheezing or shortness of breath. 09/02/21    Chesley Mires, MD  losartan (COZAAR) 100 MG tablet TAKE 1 TABLET BY MOUTH EVERY NIGHT AT BEDTIME 06/18/21   Arnoldo Lenis, MD  meloxicam (MOBIC) 7.5 MG tablet Take 1 tablet (7.5 mg total) by mouth daily. 07/15/21   Jaynee Eagles, PA-C  promethazine-dextromethorphan (PROMETHAZINE-DM) 6.25-15 MG/5ML syrup Take 5 mLs by mouth 4 (four) times daily as needed for cough. 07/27/21   Leath-Warren, Alda Lea, NP  sildenafil (VIAGRA) 100 MG tablet Take 100 mg by mouth as needed for erectile dysfunction.    [provider]  Tiotropium Bromide Monohydrate (SPIRIVA RESPIMAT) 2.5 MCG/ACT AERS Inhale 2 puffs into the lungs daily. 09/02/21   Chesley Mires, MD  tiZANidine (ZANAFLEX) 4 MG tablet Take 1 tablet (4 mg total) by mouth at bedtime. 07/15/21   Jaynee Eagles, PA-C    Family History Family History  Problem Relation Age of Onset   Lung cancer Mother    Cancer Mother        lung   Hypertension Father    Cancer Father        father   Heart disease Other    Cancer Other        colon    Social History Social History   Tobacco Use   Smoking status: Every Day    Packs/day: 2.00  Types: Cigarettes    Start date: 01/20/1975   Smokeless tobacco: Former    Types: Chew   Tobacco comments:    Smokes 5 packs of cigarettes a weeks. 09/02/21 Tay  Substance Use Topics   Alcohol use: Yes    Alcohol/week: 42.0 standard drinks of alcohol    Types: 42 Cans of beer per week   Drug use: No     Allergies   Wasp venom, Other, and Prednisone   Review of Systems Review of Systems Per HPI  Physical Exam Triage Vital Signs ED Triage Vitals  Enc Vitals Group     BP 09/10/21 0820 124/79     Pulse Rate 09/10/21 0820 92     Resp 09/10/21 0820 18     Temp 09/10/21 0820 98.1 F (36.7 C)     Temp Source 09/10/21 0820 Oral     SpO2 09/10/21 0820 97 %     Weight --      Height --      Head Circumference --      Peak Flow --      Pain Score 09/10/21 0821 3     Pain Loc --      Pain Edu? --       Excl. in [place]? --    No data found.  Updated Vital Signs BP 124/79 (BP Location: Right Arm)   Pulse 92   Temp 98.1 F (36.7 C) (Oral)   Resp 18   SpO2 97%   Visual Acuity Right Eye Distance:   Left Eye Distance:   Bilateral Distance:    Right Eye Near:   Left Eye Near:    Bilateral Near:     Physical Exam Vitals and nursing note reviewed.  Constitutional:      Appearance: He is well-developed.  HENT:     Head: Atraumatic.     Right Ear: External ear normal.     Left Ear: External ear normal.     Ears:     Comments: Significant bilateral middle ear effusion, right TM erythematous, injected    Nose: Nose normal.     Mouth/Throat:     Pharynx: No oropharyngeal exudate or posterior oropharyngeal erythema.  Eyes:     Conjunctiva/sclera: Conjunctivae normal.     Pupils: Pupils are equal, round, and reactive to light.  Cardiovascular:     Rate and Rhythm: Normal rate and regular rhythm.  Pulmonary:     Effort: Pulmonary effort is normal. No respiratory distress.     Breath sounds: No wheezing or rales.  Musculoskeletal:        General: Normal range of motion.     Cervical back: Normal range of motion and neck supple.  Lymphadenopathy:     Cervical: No cervical adenopathy.  Skin:    General: Skin is warm and dry.  Neurological:     Mental Status: He is alert and oriented to person, place, and time.  Psychiatric:        Behavior: Behavior normal.    UC Treatments / Results  Labs (all labs ordered are listed, but only abnormal results are displayed) Labs Reviewed - No data to display  EKG   Radiology No results found.  Procedures Procedures (including critical care time)  Medications Ordered in UC Medications - No data to display  Initial Impression / Assessment and Plan / UC Course  I have reviewed the triage vital signs and the nursing notes.  Pertinent labs & imaging results that were available  during my care of the patient were reviewed by me and  considered in my medical decision making (see chart for details).     We will cover with amoxicillin given progression of right ear pain from eustachian tube dysfunction developing into an ear infection.  Discussed Flonase twice daily, Coricidin HBP and other supportive medications additionally.  Return for worsening symptoms.  Final Clinical Impressions(s) / UC Diagnoses   Final diagnoses:  Acute suppurative otitis media of right ear without spontaneous rupture of tympanic membrane, recurrence not specified  Eustachian tube dysfunction, bilateral     Discharge Instructions      Try nasal sprays twice daily, decongestant such as Coricidin HBP and take your full course of antibiotics.  Return for worsening symptoms.    ED Prescriptions     Medication Sig Dispense Auth. Provider   amoxicillin (AMOXIL) 875 MG tablet Take 1 tablet (875 mg total) by mouth 2 (two) times daily. 20 tablet Volney American, [place]      PDMP not reviewed this encounter.   Merrie Roof [place], [place] 09/10/21 [phone]

## 2021-09-12 ENCOUNTER — Other Ambulatory Visit: Payer: Self-pay | Admitting: Cardiology

## 2021-09-12 ENCOUNTER — Encounter: Payer: Self-pay | Admitting: *Deleted

## 2021-09-12 NOTE — Telephone Encounter (Signed)
This encounter was created in error - please disregard.

## 2021-09-30 ENCOUNTER — Telehealth: Payer: Self-pay | Admitting: Cardiology

## 2021-09-30 ENCOUNTER — Telehealth: Payer: Self-pay | Admitting: Pulmonary Disease

## 2021-09-30 MED ORDER — LOSARTAN POTASSIUM 100 MG PO TABS: 100.0000 mg | ORAL_TABLET | Freq: Every day | ORAL | 2 refills | Status: DC

## 2021-09-30 MED ORDER — DILTIAZEM HCL ER COATED BEADS 300 MG PO CP24: ORAL_CAPSULE | ORAL | 2 refills | Status: DC

## 2021-09-30 MED ORDER — CHLORTHALIDONE 25 MG PO TABS: 12.5000 mg | ORAL_TABLET | Freq: Every day | ORAL | 3 refills | Status: DC

## 2021-09-30 NOTE — Telephone Encounter (Signed)
*  STAT* If patient is at the pharmacy, call can be transferred to refill team.   1. Which medications need to be refilled? (please list name of each medication and dose if known)   chlorthalidone (HYGROTON) 25 MG tablet diltiazem (CARDIZEM CD) 300 MG 24 hr capsule losartan (COZAAR) 100 MG tablet  2. Which pharmacy/location (including street and city if local pharmacy) is medication to be sent to?  [facility] ph[phone] [place] [place] [facility] Sites  3. Do they need a 30 day or 90 day supply? 90 day  Patient stated he still has some of these medication.

## 2021-09-30 NOTE — Telephone Encounter (Signed)
Completed.

## 2021-09-30 NOTE — Telephone Encounter (Signed)
Patient is needing to change pharmacies due to insurance purposes. Requesting Sp[facility]iva [facility]ex [facility] phone number [phone] for a 90 day supply.  Please call patient with an update.

## 2021-10-01 MED ORDER — LEVALBUTEROL TARTRATE 45 MCG/ACT IN AERO
1.0000 | INHALATION_SPRAY | Freq: Four times a day (QID) | RESPIRATORY_TRACT | 3 refills | Status: DC | PRN
Start: 2021-10-01 — End: 2022-11-05

## 2021-10-01 MED ORDER — SPIRIVA RESPIMAT 2.5 MCG/ACT IN AERS
2.0000 | INHALATION_SPRAY | Freq: Every day | RESPIRATORY_TRACT | 3 refills | Status: DC
Start: 2021-10-01 — End: 2022-03-24

## 2021-10-01 NOTE — Telephone Encounter (Signed)
Called and spoke with patient.  Patient insurance is requiring [facility] and [facility].  New Xopenex and Spiriva prescriptions sent to requested pharmacy.  Nothing further at this time.

## 2021-10-15 ENCOUNTER — Ambulatory Visit: Payer: BC Managed Care – PPO | Admitting: Family Medicine

## 2021-10-15 VITALS — BP 130/77 | HR 87 | Temp 98.4°F | Ht 72.0 in | Wt 161.0 lb

## 2021-10-15 DIAGNOSIS — M8000XA Age-related osteoporosis with current pathological fracture, unspecified site, initial encounter for fracture: Secondary | ICD-10-CM | POA: Insufficient documentation

## 2021-10-15 MED ORDER — ALENDRONATE SODIUM 70 MG PO TABS: 70.0000 mg | ORAL_TABLET | ORAL | 4 refills | Status: DC

## 2021-10-15 NOTE — Patient Instructions (Signed)
Medication as prescribed.   Vitamin D3 2000 IU daily.  Calcium (total 1200 mg daily including dietary sources).  Follow up as previously scheduled.

## 2021-10-15 NOTE — Assessment & Plan Note (Signed)
I suspect that this is severe osteoporosis given the fact that he has compression fracture.  Starting on bisphosphonate.  May need more aggressive approach.  Awaiting DEXA scan report.

## 2021-10-15 NOTE — Progress Notes (Signed)
Subjective:  Patient ID: Leonard Armstrong, male    DOB: [birth year]  Age: 66 y.o. MRN: [id number]  CC: Chief Complaint  Patient presents with   Follow-up    Neurosurgery new diagnosis osteoporosis    HPI:  66 year old male with hypertension, paroxysmal SVT, COPD/emphysema, alcohol abuse, tobacco abuse presents for follow-up.  Patient has had an ongoing compression fracture of L3.  Has recently been seen by neurosurgery.  Has had a DEXA scan which revealed osteoporosis.  Records not available.  Neurosurgery has recommended treatment and deferred this to me.  Patient is here today to discuss.  Patient reports continued low back pain.  Severe at times.  Conservative treatment has been recommended for his compression fracture.  No surgical intervention at this time.  Patient Active Problem List   Diagnosis Date Noted   Osteoporosis with current pathological fracture 10/15/2021   Alcohol abuse 08/14/2021   Tobacco abuse 08/14/2021   Preventative health care 08/14/2021   PSVT (paroxysmal supraventricular tachycardia) 08/13/2021   Pulmonary emphysema ([place]) 08/07/2021   HTN (hypertension) 06/08/2012    Social Hx   Social History   Socioeconomic History   Marital status: Married    Spouse name: Not on file   Number of children: Not on file   Years of education: Not on file   Highest education level: Not on file  Occupational History   Not on file  Tobacco Use   Smoking status: Every Day    Packs/day: 2.00    Types: Cigarettes    Start date: 01/20/1975   Smokeless tobacco: Former    Types: Chew   Tobacco comments:    Smokes 5 packs of cigarettes a weeks. 09/02/21 Tay  Substance and Sexual Activity   Alcohol use: Yes    Alcohol/week: 42.0 standard drinks of alcohol    Types: 42 Cans of beer per week   Drug use: No   Sexual activity: Yes    Partners: Female  Other Topics Concern   Not on file  Social History Narrative   Not on file   Social Determinants of Health    Financial Resource Strain: Not on file  Food Insecurity: Not on file  Transportation Needs: Not on file  Physical Activity: Not on file  Stress: Not on file  Social Connections: Not on file    Review of Systems Per HPI  Objective:  BP 130/77   Pulse 87   Temp 98.4 F (36.9 C)   Ht 6' (1.829 m)   Wt 161 lb (73 kg)   SpO2 98%   BMI 21.84 kg/m      10/15/2021    8:25 AM 09/10/2021    8:20 AM 09/02/2021    2:44 PM  BP/Weight  Systolic BP 423 536 144  Diastolic BP 77 79 70  Wt. (Lbs) 161  159  BMI 21.84 kg/m2  21.56 kg/m2    Physical Exam Vitals and nursing note reviewed.  Constitutional:      General: He is not in acute distress.    Appearance: Normal appearance.  HENT:     Head: Normocephalic and atraumatic.  Cardiovascular:     Rate and Rhythm: Normal rate and regular rhythm.  Pulmonary:     Effort: Pulmonary effort is normal.     Breath sounds: Normal breath sounds. No wheezing or rales.  Neurological:     Mental Status: He is alert.  Psychiatric:        Mood and Affect: Mood normal.  Behavior: Behavior normal.     Lab Results  Component Value Date   WBC 9.7 02/20/2021   HGB 15.7 02/20/2021   HCT 44.0 02/20/2021   PLT 303 02/20/2021   GLUCOSE 108 (H) 02/20/2021   CHOL 145 02/20/2021   TRIG 40 02/20/2021   HDL 84 02/20/2021   LDLCALC 51 02/20/2021   ALT 15 02/20/2021   AST 23 02/20/2021   NA 131 (L) 02/20/2021   K 4.2 02/20/2021   CL 91 (L) 02/20/2021   CREATININE 0.72 (L) 02/20/2021   BUN 11 02/20/2021   CO2 25 02/20/2021   TSH 0.769 02/20/2021   HGBA1C 5.1 02/20/2021     Assessment & Plan:   Problem List Items Addressed This Visit       Musculoskeletal and Integument   Osteoporosis with current pathological fracture - Primary     I suspect that this is severe osteoporosis given the fact that he has compression fracture.  Starting on bisphosphonate.  May need more aggressive approach.  Awaiting DEXA scan report.       Relevant Medications   alendronate (FOSAMAX) 70 MG tablet    Meds ordered this encounter  Medications   alendronate (FOSAMAX) 70 MG tablet    Sig: Take 1 tablet (70 mg total) by mouth every 7 (seven) days. Take with a full glass of water on an empty stomach.    Dispense:  12 tablet    Refill:  4    Follow-up: Has follow-up scheduled.  [place]

## 2021-11-12 ENCOUNTER — Encounter (HOSPITAL_COMMUNITY): Payer: BC Managed Care – PPO

## 2021-11-14 ENCOUNTER — Ambulatory Visit (HOSPITAL_COMMUNITY)
Admission: RE | Admit: 2021-11-14 | Discharge: 2021-11-14 | Disposition: A | Discharge status: Home / Self Care | Payer: BC Managed Care – PPO | Source: Ambulatory Visit | Attending: Pulmonary Disease | Admitting: Pulmonary Disease

## 2021-11-14 DIAGNOSIS — J432 Centrilobular emphysema: Secondary | ICD-10-CM | POA: Insufficient documentation

## 2021-11-14 LAB — PULMONARY FUNCTION TEST
DL/VA % pred: 58 %
DL/VA: 2.41 ml/min/mmHg/L
DLCO unc % pred: 48 %
DLCO unc: 13.86 ml/min/mmHg
FEF 25-75 Post: 1.19 L/sec
FEF 25-75 Pre: 0.86 L/sec
FEF2575-%Change-Post: 38 %
FEF2575-%Pred-Post: 40 %
FEF2575-%Pred-Pre: 29 %
FEV1-%Change-Post: 14 %
FEV1-%Pred-Post: 56 %
FEV1-%Pred-Pre: 49 %
FEV1-Post: 2.12 L
FEV1-Pre: 1.86 L
FEV1FVC-%Change-Post: 1 %
FEV1FVC-%Pred-Pre: 66 %
FEV6-%Change-Post: 11 %
FEV6-%Pred-Post: 84 %
FEV6-%Pred-Pre: 76 %
FEV6-Post: 4.03 L
FEV6-Pre: 3.6 L
FEV6FVC-%Change-Post: -3 %
FEV6FVC-%Pred-Post: 99 %
FEV6FVC-%Pred-Pre: 103 %
FVC-%Change-Post: 13 %
FVC-%Pred-Post: 84 %
FVC-%Pred-Pre: 75 %
FVC-Post: 4.23 L
FVC-Pre: 3.74 L
Post FEV1/FVC ratio: 50 %
Post FEV6/FVC ratio: 95 %
Pre FEV1/FVC ratio: 50 %
Pre FEV6/FVC Ratio: 98 %
RV % pred: 49 %
RV: 1.19 L
TLC % pred: 70 %
TLC: 5.21 L

## 2021-11-14 MED ORDER — ALBUTEROL SULFATE (2.5 MG/3ML) 0.083% IN NEBU
2.5000 mg | INHALATION_SOLUTION | Freq: Once | RESPIRATORY_TRACT | Status: AC
  Administered 2021-11-14: 2.5 mg via RESPIRATORY_TRACT

## 2021-11-20 ENCOUNTER — Ambulatory Visit: Payer: BC Managed Care – PPO | Admitting: Pulmonary Disease

## 2021-11-20 ENCOUNTER — Encounter: Payer: Self-pay | Admitting: Pulmonary Disease

## 2021-11-20 VITALS — BP 136/72 | HR 102 | Temp 98.0°F | Ht 72.0 in | Wt 168.8 lb

## 2021-11-20 DIAGNOSIS — Z720 Tobacco use: Secondary | ICD-10-CM | POA: Diagnosis not present

## 2021-11-20 DIAGNOSIS — J432 Centrilobular emphysema: Secondary | ICD-10-CM

## 2021-11-20 DIAGNOSIS — J4489 Other specified chronic obstructive pulmonary disease: Secondary | ICD-10-CM | POA: Diagnosis not present

## 2021-11-20 NOTE — Patient Instructions (Signed)
Let us know if you want to enroll in the screening program for lung cancer  Follow up in 3 months

## 2021-11-20 NOTE — Progress Notes (Signed)
[facility]  Chief Complaint  Patient presents with   Follow-up    Breathing about the same since last ov. PFTs done 11/14/21    Past Surgical History:  He  has a past surgical history that includes Back surgery; Vasectomy; and Hernia repair.  Past Medical History:  Allergies, Back pain, HTN, Tachycardia  Constitutional:  BP 136/72   Pulse (!) 102   Temp 98 F (36.7 C)   Ht 6' (1.829 m)   Wt 168 lb 12.8 oz (76.6 kg)   SpO2 96% Comment: ra  BMI 22.89 kg/m   Brief Summary:  Leonard Armstrong is a 66 y.o. male smoker with COPD from emphysema and asthma.      Subjective:   PFT showed moderate obstruction, mild restriction, moderate to severe diffusion defect, and bronchodilator response.  He started smoking at age 59.  Smoked up to 2 ppd.  He is down to 15 cigarettes per day.  He is hoping to quit in the next couple of months.  He breathing has improved with decrease in smoking.  Still has cough with clear sputum.  He is trying to walk more as part of rehab for his back, and this has improved his breathing also.  Physical Exam:   Appearance - well kempt   ENMT - no sinus tenderness, no oral exudate, no LAN, Mallampati 2 airway, no stridor  Respiratory - decreased breath sounds bilaterally, no wheezing or rales  CV - s1s2 regular rate and rhythm, no murmurs  Ext - no clubbing, no edema  Skin - no rashes  Psych - normal mood and affect    Pulmonary testing:  A1AT 09/02/21 >> 91, MM PFT 11/14/21 >> FEV1 2.12 (56%), FEV1% 50, TLC 5.21 (70%), DLCO 48%, +BD  Chest Imaging:    Cardiac Tests:  Echo 12/26/13 >> EF 60 to 65%  Social History:  He  reports that he has been smoking cigarettes. He started smoking about 46 years ago. He has been smoking an average of 2 packs per day. He has quit using smokeless tobacco.  His smokeless tobacco use included chew. He reports current alcohol use of about 42.0 standard drinks of alcohol  per week. He reports that he does not use drugs.  Family History:  His family history includes Cancer in his father, mother, and another family member; Heart disease in an other family member; Hypertension in his father; Lung cancer in his mother.     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 - continue spiriva - prn xopenex - he would like to see how he does with smoking cessation before adding an ICS - will check with his cardiologist about whether he could tolerate a LABA with hx of PSVT  Tobacco abuse. - encouraged him to keep up his progress with smoking cessation - he will think about whether he wants to enroll in the lung cancer screening program  PSVT, Hypertension. - followed by Dr. Kerry Hough with cardiology  Time Spent Involved in Patient Care on Day of Examination:  36 minutes  Follow up:   Patient Instructions  Let us know if you want to enroll in the screening program for lung cancer  Follow up in 3 months  Medication List:   Allergies as of 11/20/2021       Reactions   Wasp Venom Swelling   Other Other (See Comments)   Steroids- not in right state of mind. Makes vital signs erratic.  Prednisone Other (See Comments)   Not in right state of mind, makes vital signs erratic.         Medication List        Accurate as of November 20, 2021  9:01 AM. If you have any questions, ask your nurse or doctor.          acetaminophen 500 MG tablet Commonly known as: TYLENOL Take 1,000 mg by mouth every 6 (six) hours as needed for mild pain.   alendronate 70 MG tablet Commonly known as: FOSAMAX Take 1 tablet (70 mg total) by mouth every 7 (seven) days. Take with a full glass of water on an empty stomach.   chlorthalidone 25 MG tablet Commonly known as: HYGROTON Take 0.5 tablets (12.5 mg total) by mouth daily.   diltiazem 300 MG 24 hr capsule Commonly known as: CARDIZEM CD TAKE 1 CAPSULE(300 MG) BY MOUTH DAILY   diltiazem 60 MG  tablet Commonly known as: CARDIZEM Take 1 tablet (60 mg total) by mouth daily as needed (for breakthrough palpiations).   HYDROcodone-acetaminophen 10-325 MG tablet Commonly known as: NORCO Take 1 tablet by mouth every 6 (six) hours as needed.   levalbuterol 45 MCG/ACT inhaler Commonly known as: Xopenex HFA Inhale 1-2 puffs into the lungs every 6 (six) hours as needed for wheezing or shortness of breath.   losartan 100 MG tablet Commonly known as: COZAAR Take 1 tablet (100 mg total) by mouth at bedtime.   sildenafil 100 MG tablet Commonly known as: VIAGRA Take 100 mg by mouth as needed for erectile dysfunction.   Spiriva Respimat 2.5 MCG/ACT Aers Generic drug: Tiotropium Bromide Monohydrate Inhale 2 puffs into the lungs daily.        Signature:  Chesley Mires, MD [place] Pager - [phone] 11/20/2021, 9:01 AM

## 2022-02-13 ENCOUNTER — Ambulatory Visit: Payer: BC Managed Care – PPO | Admitting: Family Medicine

## 2022-02-13 DIAGNOSIS — S32030A Wedge compression fracture of third lumbar vertebra, initial encounter for closed fracture: Secondary | ICD-10-CM | POA: Insufficient documentation

## 2022-02-13 DIAGNOSIS — I10 Essential (primary) hypertension: Secondary | ICD-10-CM

## 2022-02-13 DIAGNOSIS — S32030S Wedge compression fracture of third lumbar vertebra, sequela: Secondary | ICD-10-CM

## 2022-02-13 DIAGNOSIS — S32030D Wedge compression fracture of third lumbar vertebra, subsequent encounter for fracture with routine healing: Secondary | ICD-10-CM

## 2022-02-13 DIAGNOSIS — L729 Follicular cyst of the skin and subcutaneous tissue, unspecified: Secondary | ICD-10-CM | POA: Insufficient documentation

## 2022-02-13 DIAGNOSIS — M4317 Spondylolisthesis, lumbosacral region: Secondary | ICD-10-CM | POA: Diagnosis not present

## 2022-02-13 NOTE — Assessment & Plan Note (Signed)
BP elevated today.  Will continue to monitor.  Continue current medications, chlorthalidone, diltiazem, losartan.

## 2022-02-13 NOTE — Assessment & Plan Note (Signed)
Benign.  Discussed close monitoring.

## 2022-02-13 NOTE — Progress Notes (Signed)
Subjective:  Patient ID: Leonard Armstrong, male    DOB: Jun 23, 1955  Age: 67 y.o. MRN: [id number]  CC: Chief Complaint  Patient presents with   Osteoporosis    Back pain and cyst check     HPI:  67 year old male with the below mentioned history presents for follow-up.  Patient reports that he has an area between his shoulder blades that he would like me to examine.  He believes that it is a cyst.  He states that his wife feels like it is getting larger.  Patient blood pressure is elevated today.  He states that he has been well-controlled recently.  He states that he is in considerable pain which may be elevating his blood pressure.  Patient continues to have low back pain.  He is having some radicular symptoms as well.  Has a known L3 compression fracture as well as spondylolisthesis at L5/S1.  He has underlying osteoporosis and is on Fosamax.  He has previously seen neurosurgery.  He states that he is having significant pain and difficulty doing his normal activities.  He would like to discuss what to do to help.  Patient Active Problem List   Diagnosis Date Noted   Compression fracture of L3 vertebra ([place]) 02/13/2022   Spondylolisthesis at L5-S1 level 02/13/2022   Cyst of skin 02/13/2022   Osteoporosis with current pathological fracture 10/15/2021   Alcohol abuse 08/14/2021   Tobacco abuse 08/14/2021   Preventative health care 08/14/2021   PSVT (paroxysmal supraventricular tachycardia) 08/13/2021   Pulmonary emphysema ([place]) 08/07/2021   HTN (hypertension) 06/08/2012    Social Hx   Social History   Socioeconomic History   Marital status: Married    Spouse name: Not on file   Number of children: Not on file   Years of education: Not on file   Highest education level: Not on file  Occupational History   Not on file  Tobacco Use   Smoking status: Every Day    Packs/day: 2.00    Types: Cigarettes    Start date: 01/20/1975   Smokeless tobacco: Former    Types: Chew    Tobacco comments:    Smokes 5 packs of cigarettes a weeks. 09/02/21 Tay  Substance and Sexual Activity   Alcohol use: Yes    Alcohol/week: 42.0 standard drinks of alcohol    Types: 42 Cans of beer per week   Drug use: No   Sexual activity: Yes    Partners: Female  Other Topics Concern   Not on file  Social History Narrative   Not on file   Social Determinants of Health   Financial Resource Strain: Not on file  Food Insecurity: Not on file  Transportation Needs: Not on file  Physical Activity: Not on file  Stress: Not on file  Social Connections: Not on file    Review of Systems Per HPI  Objective:  BP (!) 152/79   Pulse 85   Temp 98.1 F (36.7 C)   Ht 6' (1.829 m)   Wt 168 lb (76.2 kg)   SpO2 98%   BMI 22.78 kg/m      02/13/2022    8:40 AM 11/20/2021    8:32 AM 10/15/2021    8:25 AM  BP/Weight  Systolic BP 563 875 643  Diastolic BP 79 72 77  Wt. (Lbs) 168 168.8 161  BMI 22.78 kg/m2 22.89 kg/m2 21.84 kg/m2    Physical Exam Constitutional:      General: He is not in  acute distress.    Appearance: Normal appearance.  HENT:     Head: Normocephalic and atraumatic.  Eyes:     General:        Right eye: No discharge.        Left eye: No discharge.     Conjunctiva/sclera: Conjunctivae normal.  Cardiovascular:     Rate and Rhythm: Normal rate and regular rhythm.  Pulmonary:     Effort: Pulmonary effort is normal.     Breath sounds: Normal breath sounds. No wheezing, rhonchi or rales.  Skin:    Comments: Firm, raised area between the shoulder blades.  No significant erythema.  No fluctuance.  This is consistent with a cyst.  Neurological:     Mental Status: He is alert.  Psychiatric:        Mood and Affect: Mood normal.        Behavior: Behavior normal.     Lab Results  Component Value Date   WBC 9.7 02/20/2021   HGB 15.7 02/20/2021   HCT 44.0 02/20/2021   PLT 303 02/20/2021   GLUCOSE 108 (H) 02/20/2021   CHOL 145 02/20/2021   TRIG 40 02/20/2021    HDL 84 02/20/2021   LDLCALC 51 02/20/2021   ALT 15 02/20/2021   AST 23 02/20/2021   NA 131 (L) 02/20/2021   K 4.2 02/20/2021   CL 91 (L) 02/20/2021   CREATININE 0.72 (L) 02/20/2021   BUN 11 02/20/2021   CO2 25 02/20/2021   TSH 0.769 02/20/2021   HGBA1C 5.1 02/20/2021     Assessment & Plan:   Problem List Items Addressed This Visit       Cardiovascular and Mediastinum   HTN (hypertension)    BP elevated today.  Will continue to monitor.  Continue current medications, chlorthalidone, diltiazem, losartan.        Musculoskeletal and Integument   Spondylolisthesis at L5-S1 level    Discussed treatment options.  Referring to physical therapy.  Patient to consider getting a second opinion.      Relevant Orders   Ambulatory referral to Physical Therapy   Cyst of skin    Benign.  Discussed close monitoring.      Compression fracture of L3 vertebra [facility])   Relevant Orders   Ambulatory referral to Physical Therapy    Follow-up:  Return in about 3 months (around 05/14/2022).  [place]

## 2022-02-13 NOTE — Assessment & Plan Note (Signed)
Discussed treatment options.  Referring to physical therapy.  Patient to consider getting a second opinion.

## 2022-02-13 NOTE — Patient Instructions (Signed)
I have ordered the PT. They will be in touch.  Continue your medications.  Keep and eye on the cyst.  If you would like a second opinion, please let me know.  Follow up in 3 months.

## 2022-03-05 ENCOUNTER — Ambulatory Visit (HOSPITAL_COMMUNITY): Payer: BC Managed Care – PPO | Attending: Family Medicine | Admitting: Physical Therapy

## 2022-03-05 DIAGNOSIS — S32030S Wedge compression fracture of third lumbar vertebra, sequela: Secondary | ICD-10-CM | POA: Diagnosis not present

## 2022-03-05 DIAGNOSIS — M5459 Other low back pain: Secondary | ICD-10-CM | POA: Insufficient documentation

## 2022-03-05 DIAGNOSIS — R2689 Other abnormalities of gait and mobility: Secondary | ICD-10-CM | POA: Insufficient documentation

## 2022-03-05 DIAGNOSIS — M4317 Spondylolisthesis, lumbosacral region: Secondary | ICD-10-CM | POA: Diagnosis not present

## 2022-03-05 NOTE — Therapy (Signed)
OUTPATIENT PHYSICAL THERAPY THORACOLUMBAR EVALUATION   Patient Name: Leonard Armstrong MRN: [id number] DOB:[birth year], 67 y.o., male Today's Date: 03/05/2022  END OF SESSION:  PT End of Session - 03/05/22 0847     Visit Number 1    Number of Visits 12    Date for PT Re-Evaluation 04/16/22    Authorization Type BCBS    Progress Note Due on Visit 10    PT Start Time 0817    PT Stop Time 0855    PT Time Calculation (min) 38 min    Activity Tolerance Patient tolerated treatment well    Behavior During Therapy [facility] for tasks assessed/performed             Past Medical History:  Diagnosis Date   Allergy    Back pain    Cancer ([place])    Skin   Hypertension    Loss of smell    Tachycardia    Past Surgical History:  Procedure Laterality Date   [place]     Patient Active Problem List   Diagnosis Date Noted   Compression fracture of L3 vertebra ([place]) 02/13/2022   Spondylolisthesis at L5-S1 level 02/13/2022   Cyst of skin 02/13/2022   Osteoporosis with current pathological fracture 10/15/2021   Alcohol abuse 08/14/2021   Tobacco abuse 08/14/2021   Preventative health care 08/14/2021   PSVT (paroxysmal supraventricular tachycardia) 08/13/2021   Pulmonary emphysema ([place]) 08/07/2021   HTN (hypertension) 06/08/2012    PCP: Thersa Salt DO  REFERRING PROVIDER: Coral Spikes, DO  REFERRING DIAG: S32.030S (ICD-10-CM) - Compression fracture of L3 vertebra, sequela M43.17 (ICD-10-CM) - Spondylolisthesis at L5-S1 level  Rationale for Evaluation and Treatment: Rehabilitation  THERAPY DIAG:  Other low back pain - Plan: PT plan of care cert/re-cert  Other abnormalities of gait and mobility - Plan: PT plan of care cert/re-cert  ONSET DATE: Chronic >30 years   SUBJECTIVE:                                                                                                                                                                                            SUBJECTIVE STATEMENT: Patient presents to therapy with complaint of LBP. HE reports >30 year history of back problems. He has listhesis in his 47s. Recently, in July he heard a pop in his back loading a utility trailer. HE had imaging and was diagnosed with compression fracture. He was given brace and told to rest. Walking and daily activity is challenging due to back pain. He takes tylenol PRN and heating pad for  pain.   PERTINENT HISTORY:  Lumbar laminectomy 30 years ago, COPD, Osteoporosis   PAIN:  Are you having pain? Yes: NPRS scale: 1/10 at rest, 3/10 walking, standing/ lifting 7-8/10 Pain location: low back  Pain description: dull, aching, pressure  Aggravating factors: walking, standing, lifting, bending  Relieving factors: rest, heat, meds, tylenol   PRECAUTIONS: None  WEIGHT BEARING RESTRICTIONS: No  FALLS:  Has patient fallen in last 6 months? No  LIVING ENVIRONMENT: Lives with: lives with their spouse Lives in: House/apartment Stairs: Yes: External: 3 steps; on right going up Has following equipment at home: Single point cane, shower stool   OCCUPATION: Retired   PLOF: Independent  PATIENT GOALS: Want to get back a certain amount of function (mow grass, get in tractor)   NEXT MD VISIT: Unsure   OBJECTIVE: 05/15/22  DIAGNOSTIC FINDINGS:  IMPRESSION: Moderate to severe compression fracture of the L3 vertebral body, which is of indeterminate age.   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    PATIENT SURVEYS:  FOTO 47% function   COGNITION: Overall cognitive status: Within functional limits for tasks assessed     SENSATION: WFL  LUMBAR ROM:   AROM eval  Flexion 50% limited  Extension 100% limited  Right lateral flexion 25% limited  Left lateral flexion 25% limited  Right rotation   Left rotation    (Blank rows = not tested)   LOWER EXTREMITY MMT:    MMT  Right eval Left eval  Hip flexion 4 4  Hip extension 3- 3  Hip abduction 4 4-  Hip adduction    Hip internal rotation    Hip external rotation    Knee flexion    Knee extension 4 4  Ankle dorsiflexion 4+ 4+  Ankle plantarflexion    Ankle inversion    Ankle eversion     (Blank rows = not tested)  FUNCTIONAL TESTS:  2 minute walk test: 280 feet with no AD   GAIT: Decreased stride, no AD, slight trunk flexed   TODAY'S TREATMENT:                                                                                                                              DATE:  03/05/22 Eval    PATIENT EDUCATION:  Education details: on Eval findings, POC and HEP  Person educated: Patient Education method: Explanation Education comprehension: verbalized understanding  HOME EXERCISE PROGRAM: Access Code: [email or web address] URL: [email or web address] Date: 03/05/2022 Prepared by: Josue Hector  Exercises - Supine Transversus Abdominis Bracing - Hands on Stomach  - 2 x daily - 7 x weekly - 1-2 sets - 10 reps - 5 second hold - Supine March  - 2 x daily - 7 x weekly - 1-2 sets - 10 reps - Hooklying Gluteal Sets  - 2 x daily - 7 x weekly - 1-2 sets - 10 reps - 5 second hold  ASSESSMENT:  CLINICAL IMPRESSION: Patient is a 67 y.o. male who presents to physical therapy with complaint of LBP. Patient demonstrates muscle weakness, reduced ROM, and fascial restrictions which are likely contributing to symptoms of pain and are negatively impacting patient ability to perform ADLs and functional mobility tasks. Patient will benefit from skilled physical therapy services to address these deficits to reduce pain and improve level of function with ADLs and functional mobility tasks.   OBJECTIVE IMPAIRMENTS: Abnormal gait, decreased activity tolerance, decreased balance, decreased mobility, difficulty walking, decreased ROM, decreased strength, increased muscle spasms, impaired flexibility, improper  body mechanics, and pain.   ACTIVITY LIMITATIONS: carrying, lifting, bending, sitting, standing, squatting, sleeping, stairs, transfers, bed mobility, and locomotion level  PARTICIPATION LIMITATIONS: meal prep, cleaning, laundry, driving, shopping, community activity, and yard work  PERSONAL FACTORS: Past/current experiences and Time since onset of injury/illness/exacerbation are also affecting patient's functional outcome.   REHAB POTENTIAL: Good  CLINICAL DECISION MAKING: Stable/uncomplicated  EVALUATION COMPLEXITY: Low   GOALS: SHORT TERM GOALS: Target date: 03/26/2022  Patient will be independent with initial HEP and self-management strategies to improve functional outcomes Baseline:  Goal status: INITIAL    LONG TERM GOALS: Target date: 04/16/2022  Patient will be independent with advanced HEP and self-management strategies to improve functional outcomes Baseline:  Goal status: INITIAL  2.  Patient will improve FOTO score to predicted value to indicate improvement in functional outcomes Baseline: 47% function  Goal status: INITIAL  3.  Patient will report reduction of back pain to <3/10 with activity (walking/ standing)  for improved quality of life and ability to perform ADLs  Baseline: 7-8/10 Goal status: INITIAL  4. Patient will have equal to or > 4/5 MMT throughout BLE to improve ability to perform functional mobility, stair ambulation and ADLs.  Baseline: See MMT Goal status: INITIAL  5. Patient will be able to ambulate at least 325 feet during 2MWT with LRAD to demonstrate improved ability to perform functional mobility and associated tasks. Baseline: 280 feet with no AD  Goal status: INITIAL PLAN:  PT FREQUENCY: 1-2x/week  PT DURATION: 6 weeks  PLANNED INTERVENTIONS: Therapeutic exercises, Therapeutic activity, Neuromuscular re-education, Balance training, Gait training, Patient/Family education, Self Care, and Joint mobilization.  PLAN FOR NEXT SESSION:  Progress core and glute strengthening as tolerated. Gentle lumbar mobility as able. Gait and balance when ready.    8:59 AM, 03/05/22 Josue Hector PT DPT  Physical Therapist with [facility]  [phone]

## 2022-03-10 ENCOUNTER — Ambulatory Visit: Payer: BC Managed Care – PPO | Admitting: Pulmonary Disease

## 2022-03-11 ENCOUNTER — Ambulatory Visit (HOSPITAL_COMMUNITY): Payer: BC Managed Care – PPO | Attending: Family Medicine | Admitting: Physical Therapy

## 2022-03-11 DIAGNOSIS — R2689 Other abnormalities of gait and mobility: Secondary | ICD-10-CM | POA: Diagnosis present

## 2022-03-11 DIAGNOSIS — M5459 Other low back pain: Secondary | ICD-10-CM | POA: Diagnosis not present

## 2022-03-11 NOTE — Therapy (Signed)
OUTPATIENT PHYSICAL THERAPY TREATMENT   Patient Name: Leonard Armstrong MRN: [id number] DOB:Aug 12, 1955, 68 y.o., male Today's Date: 03/11/2022  END OF SESSION:  PT End of Session - 03/11/22 0811     Visit Number 2    Number of Visits 12    Date for PT Re-Evaluation 04/16/22    Authorization Type BCBS    Progress Note Due on Visit 10    PT Start Time 0814    PT Stop Time 0858    PT Time Calculation (min) 44 min    Activity Tolerance Patient tolerated treatment well    Behavior During Therapy [facility] for tasks assessed/performed             Past Medical History:  Diagnosis Date   Allergy    Back pain    Cancer ([place])    Skin   Hypertension    Loss of smell    Tachycardia    Past Surgical History:  Procedure Laterality Date   [place]     Patient Active Problem List   Diagnosis Date Noted   Compression fracture of L3 vertebra ([place]) 02/13/2022   Spondylolisthesis at L5-S1 level 02/13/2022   Cyst of skin 02/13/2022   Osteoporosis with current pathological fracture 10/15/2021   Alcohol abuse 08/14/2021   Tobacco abuse 08/14/2021   Preventative health care 08/14/2021   PSVT (paroxysmal supraventricular tachycardia) 08/13/2021   Pulmonary emphysema ([place]) 08/07/2021   HTN (hypertension) 06/08/2012    PCP: Thersa Salt DO  REFERRING PROVIDER: Coral Spikes, DO  REFERRING DIAG: S32.030S (ICD-10-CM) - Compression fracture of L3 vertebra, sequela M43.17 (ICD-10-CM) - Spondylolisthesis at L5-S1 level  Rationale for Evaluation and Treatment: Rehabilitation  THERAPY DIAG:  Other low back pain  Other abnormalities of gait and mobility  ONSET DATE: Chronic >30 years   SUBJECTIVE:                                                                                                                                                                                           SUBJECTIVE STATEMENT: Pt reports pain around 4/10 today.  States it gets  better as the day goes on.  Reports completing 1.5 sets of his exercises a day.   Evaluation: Patient presents to therapy with complaint of LBP. HE reports >30 year history of back problems. He has listhesis in his 80s. Recently, in July he heard a pop in his back loading a utility trailer. HE had imaging and was diagnosed with compression fracture. He was given brace and told to rest. Walking and daily activity  is challenging due to back pain. He takes tylenol PRN and heating pad for pain.   PERTINENT HISTORY:  Lumbar laminectomy 30 years ago, COPD, Osteoporosis   PAIN:  Are you having pain? Yes: NPRS scale: 1/10 at rest, 3/10 walking, standing/ lifting 7-8/10 Pain location: low back  Pain description: dull, aching, pressure  Aggravating factors: walking, standing, lifting, bending  Relieving factors: rest, heat, meds, tylenol   PRECAUTIONS: None  WEIGHT BEARING RESTRICTIONS: No  FALLS:  Has patient fallen in last 6 months? No  LIVING ENVIRONMENT: Lives with: lives with their spouse Lives in: House/apartment Stairs: Yes: External: 3 steps; on right going up Has following equipment at home: Single point cane, shower stool   OCCUPATION: Retired   PLOF: Independent  PATIENT GOALS: Want to get back a certain amount of function (mow grass, get in tractor)   NEXT MD VISIT: Unsure   OBJECTIVE: 05/15/22  DIAGNOSTIC FINDINGS:  IMPRESSION: Moderate to severe compression fracture of the L3 vertebral body, which is of indeterminate age.   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    PATIENT SURVEYS:  FOTO 47% function   COGNITION: Overall cognitive status: Within functional limits for tasks assessed     SENSATION: WFL  LUMBAR ROM:   AROM eval  Flexion 50% limited  Extension 100% limited  Right lateral flexion 25% limited  Left lateral flexion 25% limited  Right rotation   Left rotation    (Blank  rows = not tested)   LOWER EXTREMITY MMT:    MMT Right eval Left eval  Hip flexion 4 4  Hip extension 3- 3  Hip abduction 4 4-  Hip adduction    Hip internal rotation    Hip external rotation    Knee flexion    Knee extension 4 4  Ankle dorsiflexion 4+ 4+  Ankle plantarflexion    Ankle inversion    Ankle eversion     (Blank rows = not tested)  FUNCTIONAL TESTS:  2 minute walk test: 280 feet with no AD   GAIT: Decreased stride, no AD, slight trunk flexed   TODAY'S TREATMENT:                                                                                                                              DATE:  03/11/22 Standing:  hip excursions 5X each (no extension) Seated: piriformis stretch 2X30" each  Hamstring stretch 2X30" Supine: abdominal bracing 10X5"  March 10X  Glute sets 10X5"  Hamstring stretch with towel/strap 2X30" each  Lower trunk rotations 5X10" holds  03/05/22 Eval    PATIENT EDUCATION:  Education details: on Eval findings, POC and HEP  Person educated: Patient Education method: Explanation Education comprehension: verbalized understanding  HOME EXERCISE PROGRAM: Access Code: [email or web address] URL: [email or web address]  Date: 03/11/2022 Prepared by: Roseanne Reno Exercises - Seated Figure 4 Piriformis Stretch  - 1-2 x daily -  7 x weekly - 1 sets - 3 reps - 30 sec hold - Seated Table Hamstring Stretch  - 1-2 x daily - 7 x weekly - 1 sets - 3 reps - 30 sec hold - Supine Hamstring Stretch with Strap  - 1-2 x daily - 7 x weekly - 1 sets - 3 reps - 30 sec hold - Supine Lower Trunk Rotation  - 1-2 x daily - 7 x weekly - 1 sets - 5 reps - 10 sec hold Hip excursions (except extension) 5X each direction  Date: 03/05/2022 (evaluation) Prepared by: Josue Hector Exercises - Supine Transversus Abdominis Bracing - Hands on Stomach  - 2 x daily - 7 x weekly - 1-2 sets - 10 reps - 5 second hold - Supine March  - 2 x daily - 7 x weekly - 1-2 sets - 10  reps - Hooklying Gluteal Sets  - 2 x daily - 7 x weekly - 1-2 sets - 10 reps - 5 second hold  ASSESSMENT:  CLINICAL IMPRESSION:  Reviewed goals and POC moving forward.  Pt able to recall and complete established HEP correctly without cues.  Progressed with addition of lumbar mobility (excluding extension due to spondylisthesis).  PT is very tight in lumbar, gluteal and piriformis musculature.  Instructed in alternate way to complete hamstring stretch both in seated and supine using towel or strap.  These were printed out and added to HEP.  Pt would like to attend 1X week at this point but will increase if needed.  Patient will continue to benefit from skilled physical therapy services to address deficits, reduce pain and improve level of function with ADLs and functional mobility tasks.   OBJECTIVE IMPAIRMENTS: Abnormal gait, decreased activity tolerance, decreased balance, decreased mobility, difficulty walking, decreased ROM, decreased strength, increased muscle spasms, impaired flexibility, improper body mechanics, and pain.   ACTIVITY LIMITATIONS: carrying, lifting, bending, sitting, standing, squatting, sleeping, stairs, transfers, bed mobility, and locomotion level  PARTICIPATION LIMITATIONS: meal prep, cleaning, laundry, driving, shopping, community activity, and yard work  PERSONAL FACTORS: Past/current experiences and Time since onset of injury/illness/exacerbation are also affecting patient's functional outcome.   REHAB POTENTIAL: Good  CLINICAL DECISION MAKING: Stable/uncomplicated  EVALUATION COMPLEXITY: Low   GOALS: SHORT TERM GOALS: Target date: 03/26/2022  Patient will be independent with initial HEP and self-management strategies to improve functional outcomes Baseline:  Goal status: IN PROGRESS    LONG TERM GOALS: Target date: 04/16/2022  Patient will be independent with advanced HEP and self-management strategies to improve functional outcomes Baseline:  Goal  status: IN PROGRESS  2.  Patient will improve FOTO score to predicted value to indicate improvement in functional outcomes Baseline: 47% function  Goal status: IN PROGRESS  3.  Patient will report reduction of back pain to <3/10 with activity (walking/ standing)  for improved quality of life and ability to perform ADLs  Baseline: 7-8/10 Goal status: IN PROGRESS  4. Patient will have equal to or > 4/5 MMT throughout BLE to improve ability to perform functional mobility, stair ambulation and ADLs.  Baseline: See MMT Goal status: IN PROGRESS  5. Patient will be able to ambulate at least 325 feet during 2MWT with LRAD to demonstrate improved ability to perform functional mobility and associated tasks. Baseline: 280 feet with no AD  Goal status: IN PROGRESS PLAN:  PT FREQUENCY: 1-2x/week  PT DURATION: 6 weeks  PLANNED INTERVENTIONS: Therapeutic exercises, Therapeutic activity, Neuromuscular re-education, Balance training, Gait training, Patient/Family education, Self Care, and Joint  mobilization.  PLAN FOR NEXT SESSION: Progress core and glute strengthening as tolerated. Gentle lumbar mobility as able. Gait and balance when ready.    8:12 AM, 03/11/22 Teena Irani, PTA/CLT [place] Ph: [phone]

## 2022-03-24 ENCOUNTER — Ambulatory Visit (INDEPENDENT_AMBULATORY_CARE_PROVIDER_SITE_OTHER): Payer: BC Managed Care – PPO | Admitting: Pulmonary Disease

## 2022-03-24 ENCOUNTER — Encounter: Payer: Self-pay | Admitting: Pulmonary Disease

## 2022-03-24 VITALS — BP 134/82 | HR 90 | Ht 71.0 in | Wt 170.6 lb

## 2022-03-24 DIAGNOSIS — J4489 Other specified chronic obstructive pulmonary disease: Secondary | ICD-10-CM | POA: Diagnosis not present

## 2022-03-24 DIAGNOSIS — J342 Deviated nasal septum: Secondary | ICD-10-CM

## 2022-03-24 DIAGNOSIS — J3089 Other allergic rhinitis: Secondary | ICD-10-CM

## 2022-03-24 DIAGNOSIS — J432 Centrilobular emphysema: Secondary | ICD-10-CM

## 2022-03-24 DIAGNOSIS — Z720 Tobacco use: Secondary | ICD-10-CM | POA: Diagnosis not present

## 2022-03-24 MED ORDER — FLUTICASONE PROPIONATE 50 MCG/ACT NA SUSP: 1.0000 | Freq: Every day | NASAL | 2 refills | Status: AC

## 2022-03-24 MED ORDER — AZELASTINE HCL 0.15 % NA SOLN: 1.0000 | Freq: Every day | NASAL | Status: AC

## 2022-03-24 MED ORDER — STIOLTO RESPIMAT 2.5-2.5 MCG/ACT IN AERS: 2.0000 | INHALATION_SPRAY | Freq: Every day | RESPIRATORY_TRACT | 5 refills | Status: DC

## 2022-03-24 NOTE — Patient Instructions (Signed)
Stiolto two puffs daily  Stop spiriva once you get stiolto  Astepro 1 spray in each nostril in the morning and flonase 1 spray in each nostril in the evening during allergy season  Nasal irrigation with saline nasal rinse once or twice per day as needed to help clear sinus congestion  Call if you decide to enroll in the lung cancer screening program  Follow up in 6 months

## 2022-03-24 NOTE — Progress Notes (Signed)
[facility]  Chief Complaint  Patient presents with   Follow-up    Breathing doing ok but has seasonal allergies  Wants to discuss cough medicine     Past Surgical History:  He  has a past surgical history that includes Back surgery; Vasectomy; and Hernia repair.  Past Medical History:  Allergies, Back pain, HTN, Tachycardia  Constitutional:  BP 134/82 (BP Location: Left Arm, Patient Position: Sitting)   Pulse 90   Ht 5\' 11"  (1.803 m)   Wt 170 lb 9.6 oz (77.4 kg)   SpO2 95% Comment: ra  BMI 23.79 kg/m   Brief Summary:  Leonard Armstrong is a 67 y.o. male smoker with COPD from emphysema and asthma.      Subjective:   He has more sinus congestion and post nasal drip.  Happens more when he lays down.  Happens every Spring.  Has been using flonase.  Works better than zyrtec.  Still smoke 1/2 ppd.  He is conflicted about lung cancer screening.  A doctor told him it causes a lot of false positives and unnecessary testing.  He has to use xopenex 2 to 3 times per day.  He has chest congestion and cough.  Not having chest pain, palpitations, or leg swelling.  Physical Exam:   Appearance - well kempt   ENMT - no sinus tenderness, no oral exudate, no LAN, Mallampati 2 airway, no stridor, deviated septum  Respiratory - equal breath sounds bilaterally, no wheezing or rales  CV - s1s2 regular rate and rhythm, no murmurs  Ext - no clubbing, no edema  Skin - no rashes  Psych - normal mood and affect     Pulmonary testing:  A1AT 09/02/21 >> 91, MM PFT 11/14/21 >> FEV1 2.12 (56%), FEV1% 50, TLC 5.21 (70%), DLCO 48%, +BD  Chest Imaging:    Cardiac Tests:  Echo 12/26/13 >> EF 60 to 65%  Social History:  He  reports that he has been smoking cigarettes. He started smoking about 47 years ago. He has been smoking an average of 2 packs per day. He has quit using smokeless tobacco.  His smokeless tobacco use included chew. He reports  current alcohol use of about 42.0 standard drinks of alcohol per week. He reports that he does not use drugs.  Family History:  His family history includes Cancer in his father, mother, and another family member; Heart disease in an other family member; Hypertension in his father; Lung cancer in his mother.     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 - will have him try stiolto two puffs daily in place of spiriva - prn xopenex  Tobacco abuse. - he is trying to quit on his own - reviewed options to assist with smoking cessation - addressed his concerns about benefits/downsides for lung cancer screening program; he will think about whether he would like to enroll  Perennial allergic rhinitis with deviated nasal septum. - prn nasal irrigation, astepro, flonase - he will call if cough persists, and then decide if he needs refill for promethazine  PSVT, Hypertension. - followed by Dr. Kerry Hough with cardiology  Time Spent Involved in Patient Care on Day of Examination:  35 minutes  Follow up:   Patient Instructions  Stiolto two puffs daily  Stop spiriva once you get stiolto  Astepro 1 spray in each nostril in the morning and flonase 1 spray in each nostril in the evening during allergy season  Nasal irrigation with saline nasal rinse once or twice per day as needed to help clear sinus congestion  Call if you decide to enroll in the lung cancer screening program  Follow up in 6 months  Medication List:   Allergies as of 03/24/2022       Reactions   Wasp Venom Swelling   Other Other (See Comments)   Steroids- not in right state of mind. Makes vital signs erratic.    Prednisone Other (See Comments)   Not in right state of mind, makes vital signs erratic.         Medication List        Accurate as of March 24, 2022  8:37 AM. If you have any questions, ask your nurse or doctor.          STOP taking these medications    Spiriva Respimat 2.5  MCG/ACT Aers Generic drug: Tiotropium Bromide Monohydrate Stopped by: Chesley Mires, MD       TAKE these medications    acetaminophen 500 MG tablet Commonly known as: TYLENOL Take 1,000 mg by mouth every 6 (six) hours as needed for mild pain.   alendronate 70 MG tablet Commonly known as: FOSAMAX Take 1 tablet (70 mg total) by mouth every 7 (seven) days. Take with a full glass of water on an empty stomach.   Azelastine HCl 0.15 % Soln Commonly known as: Astepro Place 1 spray into the nose daily in the afternoon. Started by: Chesley Mires, MD   chlorthalidone 25 MG tablet Commonly known as: HYGROTON Take 0.5 tablets (12.5 mg total) by mouth daily.   diltiazem 300 MG 24 hr capsule Commonly known as: CARDIZEM CD TAKE 1 CAPSULE(300 MG) BY MOUTH DAILY   diltiazem 60 MG tablet Commonly known as: CARDIZEM Take 1 tablet (60 mg total) by mouth daily as needed (for breakthrough palpiations).   fluticasone 50 MCG/ACT nasal spray Commonly known as: FLONASE Place 1 spray into both nostrils daily. Started by: Chesley Mires, MD   HYDROcodone-acetaminophen 10-325 MG tablet Commonly known as: NORCO Take 1 tablet by mouth every 6 (six) hours as needed.   levalbuterol 45 MCG/ACT inhaler Commonly known as: Xopenex HFA Inhale 1-2 puffs into the lungs every 6 (six) hours as needed for wheezing or shortness of breath.   losartan 100 MG tablet Commonly known as: COZAAR Take 1 tablet (100 mg total) by mouth at bedtime.   Stiolto Respimat 2.5-2.5 MCG/ACT Aers Generic drug: Tiotropium Bromide-Olodaterol Inhale 2 puffs into the lungs daily. Started by: Chesley Mires, MD        Signature:  Chesley Mires, MD [place] Pager - [phone] 03/24/2022, 8:37 AM

## 2022-03-26 ENCOUNTER — Ambulatory Visit (HOSPITAL_COMMUNITY): Payer: BC Managed Care – PPO | Admitting: Physical Therapy

## 2022-03-26 DIAGNOSIS — R2689 Other abnormalities of gait and mobility: Secondary | ICD-10-CM

## 2022-03-26 DIAGNOSIS — M5459 Other low back pain: Secondary | ICD-10-CM | POA: Diagnosis not present

## 2022-03-26 NOTE — Therapy (Signed)
OUTPATIENT PHYSICAL THERAPY TREATMENT   Patient Name: Leonard Armstrong MRN: [id number] DOB:Dec 05, 1955, 67 y.o., male Today's Date: 03/26/2022  END OF SESSION:  PT End of Session - 03/26/22 0902     Visit Number 3    Number of Visits 12    Date for PT Re-Evaluation 04/16/22    Authorization Type BCBS    Progress Note Due on Visit 10    PT Start Time 0904    PT Stop Time 0943    PT Time Calculation (min) 39 min    Activity Tolerance Patient tolerated treatment well    Behavior During Therapy [facility] for tasks assessed/performed             Past Medical History:  Diagnosis Date   Allergy    Back pain    Cancer ([place])    Skin   Hypertension    Loss of smell    Tachycardia    Past Surgical History:  Procedure Laterality Date   BACK SURGERY     HERNIA REPAIR     VASECTOMY     Patient Active Problem List   Diagnosis Date Noted   Compression fracture of L3 vertebra ([place]) 02/13/2022   Spondylolisthesis at L5-S1 level 02/13/2022   Cyst of skin 02/13/2022   Osteoporosis with current pathological fracture 10/15/2021   Alcohol abuse 08/14/2021   Tobacco abuse 08/14/2021   Preventative health care 08/14/2021   PSVT (paroxysmal supraventricular tachycardia) 08/13/2021   Pulmonary emphysema ([place]) 08/07/2021   HTN (hypertension) 06/08/2012    PCP: Thersa Salt DO  REFERRING PROVIDER: Coral Spikes, DO  REFERRING DIAG: S32.030S (ICD-10-CM) - Compression fracture of L3 vertebra, sequela M43.17 (ICD-10-CM) - Spondylolisthesis at L5-S1 level  Rationale for Evaluation and Treatment: Rehabilitation  THERAPY DIAG:  Other low back pain  Other abnormalities of gait and mobility  ONSET DATE: Chronic >30 years   SUBJECTIVE:                                                                                                                                                                                           SUBJECTIVE STATEMENT: Patient reports compliance with HEP. States he is  more sore today. He says there is one stretch he cannot due because it is painful and he is not sure of the form on some others.   Evaluation: Patient presents to therapy with complaint of LBP. HE reports >30 year history of back problems. He has listhesis in his 39s. Recently, in July he heard a pop in his back loading a utility trailer. HE had imaging and was diagnosed with compression fracture. He was given  brace and told to rest. Walking and daily activity is challenging due to back pain. He takes tylenol PRN and heating pad for pain.   PERTINENT HISTORY:  Lumbar laminectomy 30 years ago, COPD, Osteoporosis   PAIN:  Are you having pain? Yes: NPRS scale: 5/10 Pain location: low back  Pain description: dull, aching, pressure  Aggravating factors: walking, standing, lifting, bending  Relieving factors: rest, heat, meds, tylenol   PRECAUTIONS: None  WEIGHT BEARING RESTRICTIONS: No  FALLS:  Has patient fallen in last 6 months? No  LIVING ENVIRONMENT: Lives with: lives with their spouse Lives in: House/apartment Stairs: Yes: External: 3 steps; on right going up Has following equipment at home: Single point cane, shower stool   OCCUPATION: Retired   PLOF: Independent  PATIENT GOALS: Want to get back a certain amount of function (mow grass, get in tractor)   NEXT MD VISIT: Unsure   OBJECTIVE: 05/15/22  DIAGNOSTIC FINDINGS:  IMPRESSION: Moderate to severe compression fracture of the L3 vertebral body, which is of indeterminate age.   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    PATIENT SURVEYS:  FOTO 47% function   COGNITION: Overall cognitive status: Within functional limits for tasks assessed     SENSATION: WFL  LUMBAR ROM:   AROM eval  Flexion 50% limited  Extension 100% limited  Right lateral flexion 25% limited  Left lateral flexion 25% limited  Right rotation   Left rotation    (Blank  rows = not tested)   LOWER EXTREMITY MMT:    MMT Right eval Left eval  Hip flexion 4 4  Hip extension 3- 3  Hip abduction 4 4-  Hip adduction    Hip internal rotation    Hip external rotation    Knee flexion    Knee extension 4 4  Ankle dorsiflexion 4+ 4+  Ankle plantarflexion    Ankle inversion    Ankle eversion     (Blank rows = not tested)  FUNCTIONAL TESTS:  2 minute walk test: 280 feet with no AD   GAIT: Decreased stride, no AD, slight trunk flexed   TODAY'S TREATMENT:                                                                                                                              DATE:  03/26/22 HEP review  Seated hamstring stretch 3 x 30" Seated piriformis stretch 3 x 30"  Ab brace 10 x5" Ab march x20 SLR with ab brace x10 each  Partial bridge 10 x 5"   03/11/22 Standing: hip excursions 5X each (no extension) Seated: piriformis stretch 2X30" each  Hamstring stretch 2X30" Supine: abdominal bracing 10X5"  March 10X  Glute sets 10X5"  Hamstring stretch with towel/strap 2X30" each  Lower trunk rotations 5X10" holds  03/05/22 Eval    PATIENT EDUCATION:  Education details: on Eval findings, POC and HEP  Person educated: Patient Education method: Explanation Education comprehension: verbalized understanding  HOME EXERCISE PROGRAM: Access Code: [email or web address] URL: [email or web address] 03/26/22 - Seated Hamstring Stretch  - 1-2 x daily - 7 x weekly - 1 sets - 3 reps - 30 second hold - Small Range Straight Leg Raise  - 1 x daily - 7 x weekly - 2 sets - 10 reps  Date: 03/11/2022 Prepared by: Roseanne Reno Exercises - Seated Figure 4 Piriformis Stretch  - 1-2 x daily - 7 x weekly - 1 sets - 3 reps - 30 sec hold - Seated Table Hamstring Stretch  - 1-2 x daily - 7 x weekly - 1 sets - 3 reps - 30 sec hold - Supine Hamstring Stretch with Strap  - 1-2 x daily - 7 x weekly - 1 sets - 3 reps - 30 sec hold - Supine Lower Trunk Rotation  - 1-2 x  daily - 7 x weekly - 1 sets - 5 reps - 10 sec hold Hip excursions (except extension) 5X each direction  Date: 03/05/2022 (evaluation) Prepared by: Josue Hector Exercises - Supine Transversus Abdominis Bracing - Hands on Stomach  - 2 x daily - 7 x weekly - 1-2 sets - 10 reps - 5 second hold - Supine March  - 2 x daily - 7 x weekly - 1-2 sets - 10 reps - Hooklying Gluteal Sets  - 2 x daily - 7 x weekly - 1-2 sets - 10 reps - 5 second hold  ASSESSMENT:  CLINICAL IMPRESSION: Patient tolerated session well overall. Reviewed HEP and made modification or improved patient comfort and tolerance. Progressed core strength with added SLR. Patient well challenged with hip bridge, performing partial range with isometric hold to tolerance. Updated HEP and issued handout. Patient will continue to benefit from skilled therapy services to reduce remaining deficits and improve functional ability.    OBJECTIVE IMPAIRMENTS: Abnormal gait, decreased activity tolerance, decreased balance, decreased mobility, difficulty walking, decreased ROM, decreased strength, increased muscle spasms, impaired flexibility, improper body mechanics, and pain.   ACTIVITY LIMITATIONS: carrying, lifting, bending, sitting, standing, squatting, sleeping, stairs, transfers, bed mobility, and locomotion level  PARTICIPATION LIMITATIONS: meal prep, cleaning, laundry, driving, shopping, community activity, and yard work  PERSONAL FACTORS: Past/current experiences and Time since onset of injury/illness/exacerbation are also affecting patient's functional outcome.   REHAB POTENTIAL: Good  CLINICAL DECISION MAKING: Stable/uncomplicated  EVALUATION COMPLEXITY: Low   GOALS: SHORT TERM GOALS: Target date: 03/26/2022  Patient will be independent with initial HEP and self-management strategies to improve functional outcomes Baseline:  Goal status: IN PROGRESS    LONG TERM GOALS: Target date: 04/16/2022  Patient will be independent  with advanced HEP and self-management strategies to improve functional outcomes Baseline:  Goal status: IN PROGRESS  2.  Patient will improve FOTO score to predicted value to indicate improvement in functional outcomes Baseline: 47% function  Goal status: IN PROGRESS  3.  Patient will report reduction of back pain to <3/10 with activity (walking/ standing)  for improved quality of life and ability to perform ADLs  Baseline: 7-8/10 Goal status: IN PROGRESS  4. Patient will have equal to or > 4/5 MMT throughout BLE to improve ability to perform functional mobility, stair ambulation and ADLs.  Baseline: See MMT Goal status: IN PROGRESS  5. Patient will be able to ambulate at least 325 feet during 2MWT with LRAD to demonstrate improved ability to perform functional mobility and associated tasks. Baseline: 280 feet with no AD  Goal status:  IN PROGRESS PLAN:  PT FREQUENCY: 1-2x/week  PT DURATION: 6 weeks  PLANNED INTERVENTIONS: Therapeutic exercises, Therapeutic activity, Neuromuscular re-education, Balance training, Gait training, Patient/Family education, Self Care, and Joint mobilization.  PLAN FOR NEXT SESSION: Progress core and glute strengthening as tolerated. Gentle lumbar mobility as able. Gait and balance when ready.   9:07 AM, 03/26/22 Josue Hector PT DPT  Physical Therapist with [facility]  [phone]

## 2022-03-28 ENCOUNTER — Ambulatory Visit (HOSPITAL_COMMUNITY): Payer: BC Managed Care – PPO

## 2022-03-28 DIAGNOSIS — M5459 Other low back pain: Secondary | ICD-10-CM

## 2022-03-28 DIAGNOSIS — R2689 Other abnormalities of gait and mobility: Secondary | ICD-10-CM

## 2022-03-28 NOTE — Therapy (Addendum)
OUTPATIENT PHYSICAL THERAPY TREATMENT   Patient Name: Leonard Armstrong MRN: [id number] DOB:11-17-1955, 67 y.o., male Today's Date: 03/28/2022  END OF SESSION:  PT End of Session - 03/28/22 0810     Visit Number 4    Number of Visits 12    Date for PT Re-Evaluation 04/16/22    Authorization Type BCBS    Progress Note Due on Visit 10    PT Start Time 0815    PT Stop Time [id number]    PT Time Calculation (min) 40 min             Past Medical History:  Diagnosis Date   Allergy    Back pain    Cancer ([place])    Skin   Hypertension    Loss of smell    Tachycardia    Past Surgical History:  Procedure Laterality Date   BACK SURGERY     HERNIA REPAIR     VASECTOMY     Patient Active Problem List   Diagnosis Date Noted   Compression fracture of L3 vertebra ([place]) 02/13/2022   Spondylolisthesis at L5-S1 level 02/13/2022   Cyst of skin 02/13/2022   Osteoporosis with current pathological fracture 10/15/2021   Alcohol abuse 08/14/2021   Tobacco abuse 08/14/2021   Preventative health care 08/14/2021   PSVT (paroxysmal supraventricular tachycardia) 08/13/2021   Pulmonary emphysema ([place]) 08/07/2021   HTN (hypertension) 06/08/2012    PCP: Thersa Salt DO  REFERRING PROVIDER: Coral Spikes, DO  REFERRING DIAG: S32.030S (ICD-10-CM) - Compression fracture of L3 vertebra, sequela M43.17 (ICD-10-CM) - Spondylolisthesis at L5-S1 level  Rationale for Evaluation and Treatment: Rehabilitation  THERAPY DIAG:  Other low back pain  Other abnormalities of gait and mobility  ONSET DATE: Chronic >30 years   SUBJECTIVE:                                                                                                                                                                                           SUBJECTIVE STATEMENT: "Did a reset on my HEP on Wed"; still quite sore mostly in legs; pain in back and hips.    Evaluation: Patient presents to therapy with complaint of LBP. HE reports  >30 year history of back problems. He has listhesis in his 65s. Recently, in July he heard a pop in his back loading a utility trailer. HE had imaging and was diagnosed with compression fracture. He was given brace and told to rest. Walking and daily activity is challenging due to back pain. He takes tylenol PRN and heating pad for pain.   PERTINENT HISTORY:  Lumbar laminectomy 30 years ago,  COPD, Osteoporosis   PAIN:  Are you having pain? Yes: NPRS scale: 5/10 Pain location: low back  Pain description: dull, aching, pressure  Aggravating factors: walking, standing, lifting, bending  Relieving factors: rest, heat, meds, tylenol   PRECAUTIONS: None  WEIGHT BEARING RESTRICTIONS: No  FALLS:  Has patient fallen in last 6 months? No  LIVING ENVIRONMENT: Lives with: lives with their spouse Lives in: House/apartment Stairs: Yes: External: 3 steps; on right going up Has following equipment at home: Single point cane, shower stool   OCCUPATION: Retired   PLOF: Independent  PATIENT GOALS: Want to get back a certain amount of function (mow grass, get in tractor)   NEXT MD VISIT: Unsure   OBJECTIVE: 05/15/22  DIAGNOSTIC FINDINGS:  IMPRESSION: Moderate to severe compression fracture of the L3 vertebral body, which is of indeterminate age.   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    PATIENT SURVEYS:  FOTO 47% function   COGNITION: Overall cognitive status: Within functional limits for tasks assessed     SENSATION: WFL  LUMBAR ROM:   AROM eval  Flexion 50% limited  Extension 100% limited  Right lateral flexion 25% limited  Left lateral flexion 25% limited  Right rotation   Left rotation    (Blank rows = not tested)   LOWER EXTREMITY MMT:    MMT Right eval Left eval  Hip flexion 4 4  Hip extension 3- 3  Hip abduction 4 4-  Hip adduction    Hip internal rotation    Hip external rotation     Knee flexion    Knee extension 4 4  Ankle dorsiflexion 4+ 4+  Ankle plantarflexion    Ankle inversion    Ankle eversion     (Blank rows = not tested)  FUNCTIONAL TESTS:  2 minute walk test: 280 feet with no AD   GAIT: Decreased stride, no AD, slight trunk flexed   TODAY'S TREATMENT:                                                                                                                              DATE:  03/28/22 Seated hamstring stretch 3 x 30" Seated piriformis stretch 3 x 30" Supine: Abdominal bracing 5" x 10 Abdominal bracing with march x 20 Abdominal bracing with SLR 2 x 10 Abdominal bracing with hip adduction with ball 5" x 10 Abdominal bracing with hip abduction with RTB 2 x 10 LTR x 10 Glute sets 5" hold x 10   03/26/22 HEP review  Seated hamstring stretch 3 x 30" Seated piriformis stretch 3 x 30"  Ab brace 10 x5" Ab march x20 SLR with ab brace x10 each  Partial bridge 10 x 5"   03/11/22 Standing: hip excursions 5X each (no extension) Seated: piriformis stretch 2X30" each  Hamstring stretch 2X30" Supine: abdominal bracing 10X5"  March 10X  Glute sets 10X5"  Hamstring  stretch with towel/strap 2X30" each  Lower trunk rotations 5X10" holds  03/05/22 Eval    PATIENT EDUCATION:  Education details: on Eval findings, POC and HEP  Person educated: Patient Education method: Explanation Education comprehension: verbalized understanding  HOME EXERCISE PROGRAM: 03/28/22 hip adduction with ball, hip abduction with RTB Access Code: [email or web address] URL: [email or web address] 03/26/22 - Seated Hamstring Stretch  - 1-2 x daily - 7 x weekly - 1 sets - 3 reps - 30 second hold - Small Range Straight Leg Raise  - 1 x daily - 7 x weekly - 2 sets - 10 reps  Date: 03/11/2022 Prepared by: Roseanne Reno Exercises - Seated Figure 4 Piriformis Stretch  - 1-2 x daily - 7 x weekly - 1 sets - 3 reps - 30 sec hold - Seated Table Hamstring Stretch  - 1-2 x daily -  7 x weekly - 1 sets - 3 reps - 30 sec hold - Supine Hamstring Stretch with Strap  - 1-2 x daily - 7 x weekly - 1 sets - 3 reps - 30 sec hold - Supine Lower Trunk Rotation  - 1-2 x daily - 7 x weekly - 1 sets - 5 reps - 10 sec hold Hip excursions (except extension) 5X each direction  Date: 03/05/2022 (evaluation) Prepared by: Josue Hector Exercises - Supine Transversus Abdominis Bracing - Hands on Stomach  - 2 x daily - 7 x weekly - 1-2 sets - 10 reps - 5 second hold - Supine March  - 2 x daily - 7 x weekly - 1-2 sets - 10 reps - Hooklying Gluteal Sets  - 2 x daily - 7 x weekly - 1-2 sets - 10 reps - 5 second hold  ASSESSMENT:  CLINICAL IMPRESSION: Today's session continued to address low back pain; core strengthening and mobility.   Progressed core strengthening with hip adduction and abduction in hooklying position; needs cues to contract abdominals during exercise. Updated HEP and issued RTB for home use. Generally has back and leg discomfort throughout treatment today.  Patient will continue to benefit from skilled therapy services to reduce remaining deficits and improve functional ability.    OBJECTIVE IMPAIRMENTS: Abnormal gait, decreased activity tolerance, decreased balance, decreased mobility, difficulty walking, decreased ROM, decreased strength, increased muscle spasms, impaired flexibility, improper body mechanics, and pain.   ACTIVITY LIMITATIONS: carrying, lifting, bending, sitting, standing, squatting, sleeping, stairs, transfers, bed mobility, and locomotion level  PARTICIPATION LIMITATIONS: meal prep, cleaning, laundry, driving, shopping, community activity, and yard work  PERSONAL FACTORS: Past/current experiences and Time since onset of injury/illness/exacerbation are also affecting patient's functional outcome.   REHAB POTENTIAL: Good  CLINICAL DECISION MAKING: Stable/uncomplicated  EVALUATION COMPLEXITY: Low   GOALS: SHORT TERM GOALS: Target date:  03/26/2022  Patient will be independent with initial HEP and self-management strategies to improve functional outcomes Baseline:  Goal status: IN PROGRESS    LONG TERM GOALS: Target date: 04/16/2022  Patient will be independent with advanced HEP and self-management strategies to improve functional outcomes Baseline:  Goal status: IN PROGRESS  2.  Patient will improve FOTO score to predicted value to indicate improvement in functional outcomes Baseline: 47% function  Goal status: IN PROGRESS  3.  Patient will report reduction of back pain to <3/10 with activity (walking/ standing)  for improved quality of life and ability to perform ADLs  Baseline: 7-8/10 Goal status: IN PROGRESS  4. Patient will have equal to or > 4/5 MMT throughout BLE to improve ability to perform functional mobility, stair  ambulation and ADLs.  Baseline: See MMT Goal status: IN PROGRESS  5. Patient will be able to ambulate at least 325 feet during 2MWT with LRAD to demonstrate improved ability to perform functional mobility and associated tasks. Baseline: 280 feet with no AD  Goal status: IN PROGRESS PLAN:  PT FREQUENCY: 1-2x/week  PT DURATION: 6 weeks  PLANNED INTERVENTIONS: Therapeutic exercises, Therapeutic activity, Neuromuscular re-education, Balance training, Gait training, Patient/Family education, Self Care, and Joint mobilization.  PLAN FOR NEXT SESSION: Progress core and glute strengthening as tolerated. Gentle lumbar mobility as able. Gait and balance when ready.   8:59 AM, 03/28/22 Zayveon Raschke Small Chikita Dogan MPT [facility] [place] [phone]

## 2022-04-02 ENCOUNTER — Ambulatory Visit: Payer: BC Managed Care – PPO | Admitting: Cardiology

## 2022-04-03 ENCOUNTER — Ambulatory Visit (HOSPITAL_COMMUNITY): Payer: BC Managed Care – PPO | Admitting: Physical Therapy

## 2022-04-03 DIAGNOSIS — M5459 Other low back pain: Secondary | ICD-10-CM | POA: Diagnosis not present

## 2022-04-03 DIAGNOSIS — R2689 Other abnormalities of gait and mobility: Secondary | ICD-10-CM

## 2022-04-03 NOTE — Therapy (Signed)
OUTPATIENT PHYSICAL THERAPY TREATMENT   Patient Name: Leonard Armstrong MRN: [id number] DOB:March 09, 1955, 67 y.o., male Today's Date: 04/03/2022  END OF SESSION:  PT End of Session - 04/03/22 0811     Visit Number 5    Number of Visits 12    Date for PT Re-Evaluation 04/16/22    Authorization Type BCBS    Progress Note Due on Visit 10    PT Start Time 0819    PT Stop Time 0857    PT Time Calculation (min) 38 min    Activity Tolerance Patient tolerated treatment well    Behavior During Therapy [facility] for tasks assessed/performed             Past Medical History:  Diagnosis Date   Allergy    Back pain    Cancer ([place])    Skin   Hypertension    Loss of smell    Tachycardia    Past Surgical History:  Procedure Laterality Date   Finger     Patient Active Problem List   Diagnosis Date Noted   Compression fracture of L3 vertebra ([place]) 02/13/2022   Spondylolisthesis at L5-S1 level 02/13/2022   Cyst of skin 02/13/2022   Osteoporosis with current pathological fracture 10/15/2021   Alcohol abuse 08/14/2021   Tobacco abuse 08/14/2021   Preventative health care 08/14/2021   PSVT (paroxysmal supraventricular tachycardia) 08/13/2021   Pulmonary emphysema ([place]) 08/07/2021   HTN (hypertension) 06/08/2012    PCP: Thersa Salt DO  REFERRING PROVIDER: Coral Spikes, DO  REFERRING DIAG: S32.030S (ICD-10-CM) - Compression fracture of L3 vertebra, sequela M43.17 (ICD-10-CM) - Spondylolisthesis at L5-S1 level  Rationale for Evaluation and Treatment: Rehabilitation  THERAPY DIAG:  Other low back pain  Other abnormalities of gait and mobility  ONSET DATE: Chronic >30 years   SUBJECTIVE:                                                                                                                                                                                           SUBJECTIVE STATEMENT: Feels HEP is going better. Still having muscle  soreness but pain is a little less.   Evaluation: Patient presents to therapy with complaint of LBP. HE reports >30 year history of back problems. He has listhesis in his 23s. Recently, in July he heard a pop in his back loading a utility trailer. HE had imaging and was diagnosed with compression fracture. He was given brace and told to rest. Walking and daily activity is challenging due to back pain. He takes tylenol PRN and  heating pad for pain.   PERTINENT HISTORY:  Lumbar laminectomy 30 years ago, COPD, Osteoporosis   PAIN:  Are you having pain? Yes: NPRS scale: 4/10 Pain location: low back  Pain description: dull, aching, pressure  Aggravating factors: walking, standing, lifting, bending  Relieving factors: rest, heat, meds, tylenol   PRECAUTIONS: None  WEIGHT BEARING RESTRICTIONS: No  FALLS:  Has patient fallen in last 6 months? No  LIVING ENVIRONMENT: Lives with: lives with their spouse Lives in: House/apartment Stairs: Yes: External: 3 steps; on right going up Has following equipment at home: Single point cane, shower stool   OCCUPATION: Retired   PLOF: Independent  PATIENT GOALS: Want to get back a certain amount of function (mow grass, get in tractor)   NEXT MD VISIT: Unsure   OBJECTIVE: 05/15/22  DIAGNOSTIC FINDINGS:  IMPRESSION: Moderate to severe compression fracture of the L3 vertebral body, which is of indeterminate age.   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    PATIENT SURVEYS:  FOTO 47% function   COGNITION: Overall cognitive status: Within functional limits for tasks assessed     SENSATION: WFL  LUMBAR ROM:   AROM eval  Flexion 50% limited  Extension 100% limited  Right lateral flexion 25% limited  Left lateral flexion 25% limited  Right rotation   Left rotation    (Blank rows = not tested)   LOWER EXTREMITY MMT:    MMT Right eval Left eval  Hip flexion 4  4  Hip extension 3- 3  Hip abduction 4 4-  Hip adduction    Hip internal rotation    Hip external rotation    Knee flexion    Knee extension 4 4  Ankle dorsiflexion 4+ 4+  Ankle plantarflexion    Ankle inversion    Ankle eversion     (Blank rows = not tested)  FUNCTIONAL TESTS:  2 minute walk test: 280 feet with no AD   GAIT: Decreased stride, no AD, slight trunk flexed   TODAY'S TREATMENT:                                                                                                                              DATE:  04/03/22 Supine: Abdominal bracing 5" x 10 Abdominal bracing with march x 20 Abdominal bracing with SLR 2 x 10 Abdominal bracing with hip adduction with ball 15 x 5"  Abdominal bracing with hip abduction with GTB 15 x 5"  Glute sets 10 x 5" Deadbug 2 x10 LTR 10 x 5" Standing hip extension 2 x 10 each   03/28/22 Seated hamstring stretch 3 x 30" Seated piriformis stretch 3 x 30" Supine: Abdominal bracing 5" x 10 Abdominal bracing with march x 20 Abdominal bracing with SLR 2 x 10 Abdominal bracing with hip adduction with ball 5" x 10 Abdominal bracing with hip abduction with RTB 2 x 10 LTR x  10 Glute sets 5" hold x 10   03/26/22 HEP review  Seated hamstring stretch 3 x 30" Seated piriformis stretch 3 x 30"  Ab brace 10 x5" Ab march x20 SLR with ab brace x10 each  Partial bridge 10 x 5"    PATIENT EDUCATION:  Education details: on Eval findings, POC and HEP  Person educated: Patient Education method: Explanation Education comprehension: verbalized understanding  HOME EXERCISE PROGRAM: 03/28/22 hip adduction with ball, hip abduction with RTB Access Code: [email or web address] URL: [email or web address]  04/03/22 - Hooklying Clamshell with Resistance  - 2 x daily - 7 x weekly - 2 sets - 10 reps - 5 second hold - Dead Bug  - 2 x daily - 7 x weekly - 2 sets - 10 reps - Standing Hip Extension with Counter Support  - 2 x daily - 7 x weekly -  1-2 sets - 10 reps  03/26/22 - Seated Hamstring Stretch  - 1-2 x daily - 7 x weekly - 1 sets - 3 reps - 30 second hold - Small Range Straight Leg Raise  - 1 x daily - 7 x weekly - 2 sets - 10 reps  Date: 03/11/2022 Prepared by: Roseanne Reno Exercises - Seated Figure 4 Piriformis Stretch  - 1-2 x daily - 7 x weekly - 1 sets - 3 reps - 30 sec hold - Seated Table Hamstring Stretch  - 1-2 x daily - 7 x weekly - 1 sets - 3 reps - 30 sec hold - Supine Hamstring Stretch with Strap  - 1-2 x daily - 7 x weekly - 1 sets - 3 reps - 30 sec hold - Supine Lower Trunk Rotation  - 1-2 x daily - 7 x weekly - 1 sets - 5 reps - 10 sec hold Hip excursions (except extension) 5X each direction  Date: 03/05/2022 (evaluation) Prepared by: Josue Hector Exercises - Supine Transversus Abdominis Bracing - Hands on Stomach  - 2 x daily - 7 x weekly - 1-2 sets - 10 reps - 5 second hold - Supine March  - 2 x daily - 7 x weekly - 1-2 sets - 10 reps - Hooklying Gluteal Sets  - 2 x daily - 7 x weekly - 1-2 sets - 10 reps - 5 second hold  ASSESSMENT:  CLINICAL IMPRESSION: Patient tolerated session well. He continues to be limited in hip extension. He is unable to perform prone hip extension and bridging is still too difficult. Added standing hip extension with cueing for proper form and purpose of activity. Added isometric holds to supine clamshell and increased resistance for improved hip and core strengthening. Patient will continue to benefit from skilled therapy services to reduce remaining deficits and improve functional ability.    OBJECTIVE IMPAIRMENTS: Abnormal gait, decreased activity tolerance, decreased balance, decreased mobility, difficulty walking, decreased ROM, decreased strength, increased muscle spasms, impaired flexibility, improper body mechanics, and pain.   ACTIVITY LIMITATIONS: carrying, lifting, bending, sitting, standing, squatting, sleeping, stairs, transfers, bed mobility, and locomotion  level  PARTICIPATION LIMITATIONS: meal prep, cleaning, laundry, driving, shopping, community activity, and yard work  PERSONAL FACTORS: Past/current experiences and Time since onset of injury/illness/exacerbation are also affecting patient's functional outcome.   REHAB POTENTIAL: Good  CLINICAL DECISION MAKING: Stable/uncomplicated  EVALUATION COMPLEXITY: Low   GOALS: SHORT TERM GOALS: Target date: 03/26/2022  Patient will be independent with initial HEP and self-management strategies to improve functional outcomes Baseline:  Goal status: IN PROGRESS    LONG TERM GOALS: Target date:  04/16/2022  Patient will be independent with advanced HEP and self-management strategies to improve functional outcomes Baseline:  Goal status: IN PROGRESS  2.  Patient will improve FOTO score to predicted value to indicate improvement in functional outcomes Baseline: 47% function  Goal status: IN PROGRESS  3.  Patient will report reduction of back pain to <3/10 with activity (walking/ standing)  for improved quality of life and ability to perform ADLs  Baseline: 7-8/10 Goal status: IN PROGRESS  4. Patient will have equal to or > 4/5 MMT throughout BLE to improve ability to perform functional mobility, stair ambulation and ADLs.  Baseline: See MMT Goal status: IN PROGRESS  5. Patient will be able to ambulate at least 325 feet during 2MWT with LRAD to demonstrate improved ability to perform functional mobility and associated tasks. Baseline: 280 feet with no AD  Goal status: IN PROGRESS PLAN:  PT FREQUENCY: 1-2x/week  PT DURATION: 6 weeks  PLANNED INTERVENTIONS: Therapeutic exercises, Therapeutic activity, Neuromuscular re-education, Balance training, Gait training, Patient/Family education, Self Care, and Joint mobilization.  PLAN FOR NEXT SESSION: Progress core and glute strengthening as tolerated. Gentle lumbar mobility as able. Gait and balance when ready.   8:57 AM, 04/03/22 Josue Hector PT DPT  Physical Therapist with [facility]  [phone]

## 2022-04-04 ENCOUNTER — Ambulatory Visit (HOSPITAL_COMMUNITY): Payer: BC Managed Care – PPO

## 2022-04-04 DIAGNOSIS — R2689 Other abnormalities of gait and mobility: Secondary | ICD-10-CM

## 2022-04-04 DIAGNOSIS — M5459 Other low back pain: Secondary | ICD-10-CM | POA: Diagnosis not present

## 2022-04-04 NOTE — Therapy (Signed)
OUTPATIENT PHYSICAL THERAPY TREATMENT   Patient Name: Leonard Armstrong MRN: [id number] DOB:1955/08/24, 67 y.o., male Today's Date: 04/04/2022  END OF SESSION:  PT End of Session - 04/04/22 0903     Visit Number 6    Number of Visits 12    Date for PT Re-Evaluation 04/16/22    Authorization Type BCBS    Progress Note Due on Visit 10    PT Start Time 0902    PT Stop Time [phone]    PT Time Calculation (min) 40 min    Activity Tolerance Patient tolerated treatment well    Behavior During Therapy [place] for tasks assessed/performed             Past Medical History:  Diagnosis Date   Allergy    Back pain    Cancer ([place])    Skin   Hypertension    Loss of smell    Tachycardia    Past Surgical History:  Procedure Laterality Date   [place]     Patient Active Problem List   Diagnosis Date Noted   Compression fracture of L3 vertebra ([place]) 02/13/2022   Spondylolisthesis at L5-S1 level 02/13/2022   Cyst of skin 02/13/2022   Osteoporosis with current pathological fracture 10/15/2021   Alcohol abuse 08/14/2021   Tobacco abuse 08/14/2021   Preventative health care 08/14/2021   PSVT (paroxysmal supraventricular tachycardia) 08/13/2021   Pulmonary emphysema ([place]) 08/07/2021   HTN (hypertension) 06/08/2012    PCP: Thersa Salt DO  REFERRING PROVIDER: Coral Spikes, DO  REFERRING DIAG: S32.030S (ICD-10-CM) - Compression fracture of L3 vertebra, sequela M43.17 (ICD-10-CM) - Spondylolisthesis at L5-S1 level  Rationale for Evaluation and Treatment: Rehabilitation  THERAPY DIAG:  Other low back pain  Other abnormalities of gait and mobility  ONSET DATE: Chronic >30 years   SUBJECTIVE:                                                                                                                                                                                           SUBJECTIVE STATEMENT: Some new soreness in legs (thighs) and back with  new exercises but no issues with HEP.  Back pain is less overall  Evaluation: Patient presents to therapy with complaint of LBP. HE reports >30 year history of back problems. He has listhesis in his 30s. Recently, in July he heard a pop in his back loading a utility trailer. HE had imaging and was diagnosed with compression fracture. He was given brace and told to rest. Walking and daily activity is challenging due to back  pain. He takes tylenol PRN and heating pad for pain.   PERTINENT HISTORY:  Lumbar laminectomy 30 years ago, COPD, Osteoporosis   PAIN:  Are you having pain? Yes: NPRS scale: 4/10 Pain location: low back  Pain description: dull, aching, pressure  Aggravating factors: walking, standing, lifting, bending  Relieving factors: rest, heat, meds, tylenol   PRECAUTIONS: None  WEIGHT BEARING RESTRICTIONS: No  FALLS:  Has patient fallen in last 6 months? No  LIVING ENVIRONMENT: Lives with: lives with their spouse Lives in: House/apartment Stairs: Yes: External: 3 steps; on right going up Has following equipment at home: Single point cane, shower stool   OCCUPATION: Retired   PLOF: Independent  PATIENT GOALS: Want to get back a certain amount of function (mow grass, get in tractor)   NEXT MD VISIT: Unsure   OBJECTIVE: 05/15/22  DIAGNOSTIC FINDINGS:  IMPRESSION: Moderate to severe compression fracture of the L3 vertebral body, which is of indeterminate age.   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    PATIENT SURVEYS:  FOTO 47% function   COGNITION: Overall cognitive status: Within functional limits for tasks assessed     SENSATION: WFL  LUMBAR ROM:   AROM eval  Flexion 50% limited  Extension 100% limited  Right lateral flexion 25% limited  Left lateral flexion 25% limited  Right rotation   Left rotation    (Blank rows = not tested)   LOWER EXTREMITY MMT:    MMT  Right eval Left eval  Hip flexion 4 4  Hip extension 3- 3  Hip abduction 4 4-  Hip adduction    Hip internal rotation    Hip external rotation    Knee flexion    Knee extension 4 4  Ankle dorsiflexion 4+ 4+  Ankle plantarflexion    Ankle inversion    Ankle eversion     (Blank rows = not tested)  FUNCTIONAL TESTS:  2 minute walk test: 280 feet with no AD   GAIT: Decreased stride, no AD, slight trunk flexed   TODAY'S TREATMENT:                                                                                                                              DATE:  04/04/22 Review of HEP while patient sitting with moist heat to low back x 5'  Supine: Abdominal bracing 5" hold x 10 Abdominal bracing with march 2 x 10 Abdominal bracing with SLR 2 x 10 each Abdominal bracing with RTB 5" hold 2 x 10 Abdominal bracing with ball for hip adduction with 5" hold 2 x 10 Glute sets 5" hold x 10 LTR 5" x 10 each Deadbug 2 x 10 Standing hip extension 2 x 10    04/03/22 Supine: Abdominal bracing 5" x 10 Abdominal bracing with march x 20 Abdominal bracing with SLR 2 x 10 Abdominal bracing with hip adduction with ball  15 x 5"  Abdominal bracing with hip abduction with GTB 15 x 5"  Glute sets 10 x 5" Deadbug 2 x10 LTR 10 x 5" Standing hip extension 2 x 10 each   03/28/22 Seated hamstring stretch 3 x 30" Seated piriformis stretch 3 x 30" Supine: Abdominal bracing 5" x 10 Abdominal bracing with march x 20 Abdominal bracing with SLR 2 x 10 Abdominal bracing with hip adduction with ball 5" x 10 Abdominal bracing with hip abduction with RTB 2 x 10 LTR x 10 Glute sets 5" hold x 10   03/26/22 HEP review  Seated hamstring stretch 3 x 30" Seated piriformis stretch 3 x 30"  Ab brace 10 x5" Ab march x20 SLR with ab brace x10 each  Partial bridge 10 x 5"    PATIENT EDUCATION:  Education details: on Eval findings, POC and HEP  Person educated: Patient Education method:  Explanation Education comprehension: verbalized understanding  HOME EXERCISE PROGRAM: 03/28/22 hip adduction with ball, hip abduction with RTB Access Code: [email or web address] URL: [email or web address]  04/03/22 - Hooklying Clamshell with Resistance  - 2 x daily - 7 x weekly - 2 sets - 10 reps - 5 second hold - Dead Bug  - 2 x daily - 7 x weekly - 2 sets - 10 reps - Standing Hip Extension with Counter Support  - 2 x daily - 7 x weekly - 1-2 sets - 10 reps  03/26/22 - Seated Hamstring Stretch  - 1-2 x daily - 7 x weekly - 1 sets - 3 reps - 30 second hold - Small Range Straight Leg Raise  - 1 x daily - 7 x weekly - 2 sets - 10 reps  Date: 03/11/2022 Prepared by: Roseanne Reno Exercises - Seated Figure 4 Piriformis Stretch  - 1-2 x daily - 7 x weekly - 1 sets - 3 reps - 30 sec hold - Seated Table Hamstring Stretch  - 1-2 x daily - 7 x weekly - 1 sets - 3 reps - 30 sec hold - Supine Hamstring Stretch with Strap  - 1-2 x daily - 7 x weekly - 1 sets - 3 reps - 30 sec hold - Supine Lower Trunk Rotation  - 1-2 x daily - 7 x weekly - 1 sets - 5 reps - 10 sec hold Hip excursions (except extension) 5X each direction  Date: 03/05/2022 (evaluation) Prepared by: Josue Hector Exercises - Supine Transversus Abdominis Bracing - Hands on Stomach  - 2 x daily - 7 x weekly - 1-2 sets - 10 reps - 5 second hold - Supine March  - 2 x daily - 7 x weekly - 1-2 sets - 10 reps - Hooklying Gluteal Sets  - 2 x daily - 7 x weekly - 1-2 sets - 10 reps - 5 second hold  ASSESSMENT:  CLINICAL IMPRESSION: Today's session continued to focus on core strengthening.  Some increased soreness from yesterday's session so started with some moist heat to low back in sitting while we reviewed his HEP; exercises that were added yesterday.  Increased reps of abdominal bracing with hip adduction and hip abduction without issue.  Good recall of exercises today with the exception of the dead bug; needed cues for correct  technique; overall with improving exercise form and good pacing of activity.   Patient will continue to benefit from skilled therapy services to reduce remaining deficits and improve functional ability.    OBJECTIVE IMPAIRMENTS: Abnormal gait, decreased activity tolerance, decreased balance, decreased mobility, difficulty  walking, decreased ROM, decreased strength, increased muscle spasms, impaired flexibility, improper body mechanics, and pain.   ACTIVITY LIMITATIONS: carrying, lifting, bending, sitting, standing, squatting, sleeping, stairs, transfers, bed mobility, and locomotion level  PARTICIPATION LIMITATIONS: meal prep, cleaning, laundry, driving, shopping, community activity, and yard work  PERSONAL FACTORS: Past/current experiences and Time since onset of injury/illness/exacerbation are also affecting patient's functional outcome.   REHAB POTENTIAL: Good  CLINICAL DECISION MAKING: Stable/uncomplicated  EVALUATION COMPLEXITY: Low   GOALS: SHORT TERM GOALS: Target date: 03/26/2022  Patient will be independent with initial HEP and self-management strategies to improve functional outcomes Baseline:  Goal status: IN PROGRESS    LONG TERM GOALS: Target date: 04/16/2022  Patient will be independent with advanced HEP and self-management strategies to improve functional outcomes Baseline:  Goal status: IN PROGRESS  2.  Patient will improve FOTO score to predicted value to indicate improvement in functional outcomes Baseline: 47% function  Goal status: IN PROGRESS  3.  Patient will report reduction of back pain to <3/10 with activity (walking/ standing)  for improved quality of life and ability to perform ADLs  Baseline: 7-8/10 Goal status: IN PROGRESS  4. Patient will have equal to or > 4/5 MMT throughout BLE to improve ability to perform functional mobility, stair ambulation and ADLs.  Baseline: See MMT Goal status: IN PROGRESS  5. Patient will be able to ambulate at least  325 feet during 2MWT with LRAD to demonstrate improved ability to perform functional mobility and associated tasks. Baseline: 280 feet with no AD  Goal status: IN PROGRESS PLAN:  PT FREQUENCY: 1-2x/week  PT DURATION: 6 weeks  PLANNED INTERVENTIONS: Therapeutic exercises, Therapeutic activity, Neuromuscular re-education, Balance training, Gait training, Patient/Family education, Self Care, and Joint mobilization.  PLAN FOR NEXT SESSION: Progress core and glute strengthening as tolerated. Gentle lumbar mobility as able. Gait and balance when ready.   9:46 AM, 04/04/22 Karey Stucki Small Knoah Nedeau MPT [facility] [place] [phone]

## 2022-04-07 ENCOUNTER — Ambulatory Visit (HOSPITAL_COMMUNITY): Payer: PPO | Attending: Family Medicine | Admitting: Physical Therapy

## 2022-04-07 DIAGNOSIS — M5459 Other low back pain: Secondary | ICD-10-CM | POA: Insufficient documentation

## 2022-04-07 DIAGNOSIS — R2689 Other abnormalities of gait and mobility: Secondary | ICD-10-CM | POA: Diagnosis not present

## 2022-04-07 NOTE — Therapy (Signed)
OUTPATIENT PHYSICAL THERAPY TREATMENT   Patient Name: Leonard Armstrong MRN: [id number] DOB:1955-07-19, 67 y.o., male Today's Date: 04/07/2022  END OF SESSION:  PT End of Session - 04/07/22 [id number]     Visit Number 7    Number of Visits 12    Date for PT Re-Evaluation 04/16/22    Authorization Type BCBS    Progress Note Due on Visit 10    PT Start Time 0820    PT Stop Time [id number]    PT Time Calculation (min) 38 min    Activity Tolerance Patient tolerated treatment well    Behavior During Therapy [facility] for tasks assessed/performed             Past Medical History:  Diagnosis Date   Allergy    Back pain    Cancer ([place])    Skin   Hypertension    Loss of smell    Tachycardia    Past Surgical History:  Procedure Laterality Date   [place]     Patient Active Problem List   Diagnosis Date Noted   Compression fracture of L3 vertebra 02/13/2022   Spondylolisthesis at L5-S1 level 02/13/2022   Cyst of skin 02/13/2022   Osteoporosis with current pathological fracture 10/15/2021   Alcohol abuse 08/14/2021   Tobacco abuse 08/14/2021   Preventative health care 08/14/2021   PSVT (paroxysmal supraventricular tachycardia) 08/13/2021   Pulmonary emphysema 08/07/2021   HTN (hypertension) 06/08/2012    PCP: Thersa Salt DO  REFERRING PROVIDER: Coral Spikes, DO  REFERRING DIAG: S32.030S (ICD-10-CM) - Compression fracture of L3 vertebra, sequela M43.17 (ICD-10-CM) - Spondylolisthesis at L5-S1 level  Rationale for Evaluation and Treatment: Rehabilitation  THERAPY DIAG:  Other low back pain  Other abnormalities of gait and mobility  ONSET DATE: Chronic >30 years   SUBJECTIVE:                                                                                                                                                                                           SUBJECTIVE STATEMENT: Patient states his RT foot is hurting today. He has been able to  mow his long. Exercises are going ok.   Evaluation: Patient presents to therapy with complaint of LBP. HE reports >30 year history of back problems. He has listhesis in his 62s. Recently, in July he heard a pop in his back loading a utility trailer. HE had imaging and was diagnosed with compression fracture. He was given brace and told to rest. Walking and daily activity is challenging due to back pain. He takes  tylenol PRN and heating pad for pain.   PERTINENT HISTORY:  Lumbar laminectomy 30 years ago, COPD, Osteoporosis   PAIN:  Are you having pain? Yes: NPRS scale: 6/10 Pain location: low back., RT ankle  Pain description: dull, aching, pressure  Aggravating factors: walking, standing, lifting, bending  Relieving factors: rest, heat, meds, tylenol   PRECAUTIONS: None  WEIGHT BEARING RESTRICTIONS: No  FALLS:  Has patient fallen in last 6 months? No  LIVING ENVIRONMENT: Lives with: lives with their spouse Lives in: House/apartment Stairs: Yes: External: 3 steps; on right going up Has following equipment at home: Single point cane, shower stool   OCCUPATION: Retired   PLOF: Independent  PATIENT GOALS: Want to get back a certain amount of function (mow grass, get in tractor)   NEXT MD VISIT: Unsure   OBJECTIVE: 05/15/22  DIAGNOSTIC FINDINGS:  IMPRESSION: Moderate to severe compression fracture of the L3 vertebral body, which is of indeterminate age.   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    PATIENT SURVEYS:  FOTO 47% function   COGNITION: Overall cognitive status: Within functional limits for tasks assessed     SENSATION: WFL  LUMBAR ROM:   AROM eval  Flexion 50% limited  Extension 100% limited  Right lateral flexion 25% limited  Left lateral flexion 25% limited  Right rotation   Left rotation    (Blank rows = not tested)   LOWER EXTREMITY MMT:    MMT Right eval Left eval   Hip flexion 4 4  Hip extension 3- 3  Hip abduction 4 4-  Hip adduction    Hip internal rotation    Hip external rotation    Knee flexion    Knee extension 4 4  Ankle dorsiflexion 4+ 4+  Ankle plantarflexion    Ankle inversion    Ankle eversion     (Blank rows = not tested)  FUNCTIONAL TESTS:  2 minute walk test: 280 feet with no AD   GAIT: Decreased stride, no AD, slight trunk flexed   TODAY'S TREATMENT:                                                                                                                              DATE:  04/07/22 Supine: Abdominal bracing 5" hold x 10 Abdominal bracing with march 2 x 10 Abdominal bracing with SLR 2 x 10 each Abdominal bracing with hip abduction with GTB 10 x 5"  Abdominal bracing with ball for hip adduction with 5" hold 2 x 10 Glute sets 5" hold x 10 LTR 5" x 10 each Deadbug 2 x 10  Standing: Calf stretch at wall 2 x 20" each Toe raises back against wall 10 x 3"  04/04/22 Review of HEP while patient sitting with moist heat to low back x 5'  Supine: Abdominal bracing 5" hold x 10 Abdominal bracing with march 2 x 10 Abdominal  bracing with SLR 2 x 10 each Abdominal bracing with RTB 5" hold 2 x 10 Abdominal bracing with ball for hip adduction with 5" hold 2 x 10 Glute sets 5" hold x 10 LTR 5" x 10 each Deadbug 2 x 10 Standing hip extension 2 x 10  04/03/22 Supine: Abdominal bracing 5" x 10 Abdominal bracing with march x 20 Abdominal bracing with SLR 2 x 10 Abdominal bracing with hip adduction with ball 15 x 5"  Abdominal bracing with hip abduction with GTB 15 x 5"  Glute sets 10 x 5" Deadbug 2 x10 LTR 10 x 5" Standing hip extension 2 x 10 each    PATIENT EDUCATION:  Education details: on Eval findings, POC and HEP  Person educated: Patient Education method: Explanation Education comprehension: verbalized understanding  HOME EXERCISE PROGRAM: 03/28/22 hip adduction with ball, hip abduction with RTB Access  Code: [email or web address] URL: [email or web address]  04/07/22 - Toe Raise With Back Against Wall  - 2 x daily - 7 x weekly - 2 sets - 10 reps - Gastroc Stretch on Wall  - 2 x daily - 7 x weekly - 1 sets - 3 reps - 20-30 second hold   04/03/22 - Hooklying Clamshell with Resistance  - 2 x daily - 7 x weekly - 2 sets - 10 reps - 5 second hold - Dead Bug  - 2 x daily - 7 x weekly - 2 sets - 10 reps - Standing Hip Extension with Counter Support  - 2 x daily - 7 x weekly - 1-2 sets - 10 reps  03/26/22 - Seated Hamstring Stretch  - 1-2 x daily - 7 x weekly - 1 sets - 3 reps - 30 second hold - Small Range Straight Leg Raise  - 1 x daily - 7 x weekly - 2 sets - 10 reps  Date: 03/11/2022 Prepared by: Roseanne Reno Exercises - Seated Figure 4 Piriformis Stretch  - 1-2 x daily - 7 x weekly - 1 sets - 3 reps - 30 sec hold - Seated Table Hamstring Stretch  - 1-2 x daily - 7 x weekly - 1 sets - 3 reps - 30 sec hold - Supine Hamstring Stretch with Strap  - 1-2 x daily - 7 x weekly - 1 sets - 3 reps - 30 sec hold - Supine Lower Trunk Rotation  - 1-2 x daily - 7 x weekly - 1 sets - 5 reps - 10 sec hold Hip excursions (except extension) 5X each direction  Date: 03/05/2022 (evaluation) Prepared by: Josue Hector Exercises - Supine Transversus Abdominis Bracing - Hands on Stomach  - 2 x daily - 7 x weekly - 1-2 sets - 10 reps - 5 second hold - Supine March  - 2 x daily - 7 x weekly - 1-2 sets - 10 reps - Hooklying Gluteal Sets  - 2 x daily - 7 x weekly - 1-2 sets - 10 reps - 5 second hold  ASSESSMENT:  CLINICAL IMPRESSION: Patient tolerated session well overall. Tolerating green resistance band well so issued green for home use. Patient cued on purpose and form with added calf stretching and toe raises. Patient still limited in hip extension and glute activation demod by difficulty performing hip bridge. Patient will continue to benefit from skilled therapy services to reduce remaining deficits and  improve functional ability.    OBJECTIVE IMPAIRMENTS: Abnormal gait, decreased activity tolerance, decreased balance, decreased mobility, difficulty walking, decreased ROM, decreased strength, increased muscle spasms, impaired flexibility,  improper body mechanics, and pain.   ACTIVITY LIMITATIONS: carrying, lifting, bending, sitting, standing, squatting, sleeping, stairs, transfers, bed mobility, and locomotion level  PARTICIPATION LIMITATIONS: meal prep, cleaning, laundry, driving, shopping, community activity, and yard work  PERSONAL FACTORS: Past/current experiences and Time since onset of injury/illness/exacerbation are also affecting patient's functional outcome.   REHAB POTENTIAL: Good  CLINICAL DECISION MAKING: Stable/uncomplicated  EVALUATION COMPLEXITY: Low   GOALS: SHORT TERM GOALS: Target date: 03/26/2022  Patient will be independent with initial HEP and self-management strategies to improve functional outcomes Baseline:  Goal status: IN PROGRESS    LONG TERM GOALS: Target date: 04/16/2022  Patient will be independent with advanced HEP and self-management strategies to improve functional outcomes Baseline:  Goal status: IN PROGRESS  2.  Patient will improve FOTO score to predicted value to indicate improvement in functional outcomes Baseline: 47% function  Goal status: IN PROGRESS  3.  Patient will report reduction of back pain to <3/10 with activity (walking/ standing)  for improved quality of life and ability to perform ADLs  Baseline: 7-8/10 Goal status: IN PROGRESS  4. Patient will have equal to or > 4/5 MMT throughout BLE to improve ability to perform functional mobility, stair ambulation and ADLs.  Baseline: See MMT Goal status: IN PROGRESS  5. Patient will be able to ambulate at least 325 feet during 2MWT with LRAD to demonstrate improved ability to perform functional mobility and associated tasks. Baseline: 280 feet with no AD  Goal status: IN  PROGRESS PLAN:  PT FREQUENCY: 1-2x/week  PT DURATION: 6 weeks  PLANNED INTERVENTIONS: Therapeutic exercises, Therapeutic activity, Neuromuscular re-education, Balance training, Gait training, Patient/Family education, Self Care, and Joint mobilization.  PLAN FOR NEXT SESSION: Progress core and glute strengthening as tolerated. Gentle lumbar mobility as able. Gait and balance when ready.   9:15 AM, 04/07/22 Josue Hector PT DPT  Physical Therapist with [facility]  [phone]

## 2022-04-09 ENCOUNTER — Ambulatory Visit (HOSPITAL_COMMUNITY): Payer: PPO | Admitting: Physical Therapy

## 2022-04-09 DIAGNOSIS — M5459 Other low back pain: Secondary | ICD-10-CM | POA: Diagnosis not present

## 2022-04-09 DIAGNOSIS — R2689 Other abnormalities of gait and mobility: Secondary | ICD-10-CM

## 2022-04-09 NOTE — Therapy (Signed)
OUTPATIENT PHYSICAL THERAPY TREATMENT   Patient Name: Leonard Armstrong MRN: [id number] DOB:Oct 11, 1955, 67 y.o., male Today's Date: 04/09/2022  END OF SESSION:  PT End of Session - 04/09/22 1037     Visit Number 8    Number of Visits 12    Date for PT Re-Evaluation 04/16/22    Authorization Type BCBS    Progress Note Due on Visit 10    PT Start Time 1034    PT Stop Time 1108    PT Time Calculation (min) 34 min    Activity Tolerance Patient tolerated treatment well    Behavior During Therapy WFL for tasks assessed/performed             Past Medical History:  Diagnosis Date   Allergy    Back pain    Cancer ([place])    Skin   Hypertension    Loss of smell    Tachycardia    Past Surgical History:  Procedure Laterality Date   [place]     Patient Active Problem List   Diagnosis Date Noted   Compression fracture of L3 vertebra 02/13/2022   Spondylolisthesis at L5-S1 level 02/13/2022   Cyst of skin 02/13/2022   Osteoporosis with current pathological fracture 10/15/2021   Alcohol abuse 08/14/2021   Tobacco abuse 08/14/2021   Preventative health care 08/14/2021   PSVT (paroxysmal supraventricular tachycardia) 08/13/2021   Pulmonary emphysema 08/07/2021   HTN (hypertension) 06/08/2012    PCP: Thersa Salt DO  REFERRING PROVIDER: Coral Spikes, DO  REFERRING DIAG: S32.030S (ICD-10-CM) - Compression fracture of L3 vertebra, sequela M43.17 (ICD-10-CM) - Spondylolisthesis at L5-S1 level  Rationale for Evaluation and Treatment: Rehabilitation  THERAPY DIAG:  Other low back pain  Other abnormalities of gait and mobility  ONSET DATE: Chronic >30 years   SUBJECTIVE:                                                                                                                                                                                           SUBJECTIVE STATEMENT: Patient states his Rt achilles tendonitis is flaired up today.   Back pain remains around a 5/10.  States he would like for today to be his last visit.  Feels he has improved slightly.   Evaluation: Patient presents to therapy with complaint of LBP. HE reports >30 year history of back problems. He has listhesis in his 79s. Recently, in July he heard a pop in his back loading a utility trailer. HE had imaging and was diagnosed with compression fracture. He was given brace and  told to rest. Walking and daily activity is challenging due to back pain. He takes tylenol PRN and heating pad for pain.   PERTINENT HISTORY:  Lumbar laminectomy 30 years ago, COPD, Osteoporosis   PAIN:  Are you having pain? Yes: NPRS scale: 6/10 Pain location: low back., RT ankle  Pain description: dull, aching, pressure  Aggravating factors: walking, standing, lifting, bending  Relieving factors: rest, heat, meds, tylenol   PRECAUTIONS: None  WEIGHT BEARING RESTRICTIONS: No  FALLS:  Has patient fallen in last 6 months? No  LIVING ENVIRONMENT: Lives with: lives with their spouse Lives in: House/apartment Stairs: Yes: External: 3 steps; on right going up Has following equipment at home: Single point cane, shower stool   OCCUPATION: Retired   PLOF: Independent  PATIENT GOALS: Want to get back a certain amount of function (mow grass, get in tractor)   NEXT MD VISIT: Unsure   OBJECTIVE: 05/15/22  DIAGNOSTIC FINDINGS:  IMPRESSION: Moderate to severe compression fracture of the L3 vertebral body, which is of indeterminate age.   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    PATIENT SURVEYS:  FOTO 04/09/22:  37.5% functional status (was 47% function at evaluation)   COGNITION: Overall cognitive status: Within functional limits for tasks assessed     SENSATION: WFL  LUMBAR ROM:   AROM eval 04/09/22  Flexion 50% limited 50% limited  Extension 100% limited 100% limited  Right lateral flexion 25%  limited 25% limited  Left lateral flexion 25% limited 25% limited  Right rotation    Left rotation     (Blank rows = not tested)   LOWER EXTREMITY MMT:    MMT Right eval Left eval Right 04/09/22 Left 04/09/22  Hip flexion 4 4 5 5   Hip extension 3- 3 3-(prone) 3- (prone)  Hip abduction 4 4- 5 (SL) 5 (SL)  Hip adduction      Hip internal rotation      Hip external rotation      Knee flexion   5 5  Knee extension 4 4 5 5   Ankle dorsiflexion 4+ 4+ 4+ 4+  Ankle plantarflexion      Ankle inversion      Ankle eversion       (Blank rows = not tested)  FUNCTIONAL TESTS:  2 minute walk test: 4/3:  330 feet no AD; evaluation:  280 feet with no AD   GAIT: Decreased stride, no AD, slight trunk flexed   TODAY'S TREATMENT:                                                                                                                              DATE:  04/09/22 Re-evaluation 2MWT:  330 feet no AD MMT (see above) FOTO 37.5% functional status (was 47% FS at evaluation) Goal review  04/07/22 Supine: Abdominal bracing 5" hold x 10 Abdominal bracing with march 2 x 10 Abdominal  bracing with SLR 2 x 10 each Abdominal bracing with hip abduction with GTB 10 x 5"  Abdominal bracing with ball for hip adduction with 5" hold 2 x 10 Glute sets 5" hold x 10 LTR 5" x 10 each Deadbug 2 x 10  Standing: Calf stretch at wall 2 x 20" each Toe raises back against wall 10 x 3"  04/04/22 Review of HEP while patient sitting with moist heat to low back x 5'  Supine: Abdominal bracing 5" hold x 10 Abdominal bracing with march 2 x 10 Abdominal bracing with SLR 2 x 10 each Abdominal bracing with RTB 5" hold 2 x 10 Abdominal bracing with ball for hip adduction with 5" hold 2 x 10 Glute sets 5" hold x 10 LTR 5" x 10 each Deadbug 2 x 10 Standing hip extension 2 x 10  04/03/22 Supine: Abdominal bracing 5" x 10 Abdominal bracing with march x 20 Abdominal bracing with SLR 2 x 10 Abdominal bracing  with hip adduction with ball 15 x 5"  Abdominal bracing with hip abduction with GTB 15 x 5"  Glute sets 10 x 5" Deadbug 2 x10 LTR 10 x 5" Standing hip extension 2 x 10 each    PATIENT EDUCATION:  Education details: on Eval findings, POC and HEP  Person educated: Patient Education method: Explanation Education comprehension: verbalized understanding  HOME EXERCISE PROGRAM: 03/28/22 hip adduction with ball, hip abduction with RTB Access Code: [email or web address] URL: [email or web address]  04/07/22 - Toe Raise With Back Against Wall  - 2 x daily - 7 x weekly - 2 sets - 10 reps - Gastroc Stretch on Wall  - 2 x daily - 7 x weekly - 1 sets - 3 reps - 20-30 second hold   04/03/22 - Hooklying Clamshell with Resistance  - 2 x daily - 7 x weekly - 2 sets - 10 reps - 5 second hold - Dead Bug  - 2 x daily - 7 x weekly - 2 sets - 10 reps - Standing Hip Extension with Counter Support  - 2 x daily - 7 x weekly - 1-2 sets - 10 reps  03/26/22 - Seated Hamstring Stretch  - 1-2 x daily - 7 x weekly - 1 sets - 3 reps - 30 second hold - Small Range Straight Leg Raise  - 1 x daily - 7 x weekly - 2 sets - 10 reps  Date: 03/11/2022 Prepared by: Roseanne Reno Exercises - Seated Figure 4 Piriformis Stretch  - 1-2 x daily - 7 x weekly - 1 sets - 3 reps - 30 sec hold - Seated Table Hamstring Stretch  - 1-2 x daily - 7 x weekly - 1 sets - 3 reps - 30 sec hold - Supine Hamstring Stretch with Strap  - 1-2 x daily - 7 x weekly - 1 sets - 3 reps - 30 sec hold - Supine Lower Trunk Rotation  - 1-2 x daily - 7 x weekly - 1 sets - 5 reps - 10 sec hold Hip excursions (except extension) 5X each direction  Date: 03/05/2022 (evaluation) Prepared by: Josue Hector Exercises - Supine Transversus Abdominis Bracing - Hands on Stomach  - 2 x daily - 7 x weekly - 1-2 sets - 10 reps - 5 second hold - Supine March  - 2 x daily - 7 x weekly - 1-2 sets - 10 reps - Hooklying Gluteal Sets  - 2 x daily - 7 x weekly - 1-2  sets - 10  reps - 5 second hold  ASSESSMENT:  CLINICAL IMPRESSION: Pt requesting discharge this visit.  Functional test measures repeated this session as well as FOTO and goal review.  Pt has met STG and 2/5 LTG at this point.  Pt has improved in LE strength but has not changed in lumbar ROM or FOTO outcome measures.  Pt is independent with HEP, which he feels is helping control his symptoms and improve his overall function.  Pt requests to be discharged at this time as he wishes to return to MD.      OBJECTIVE IMPAIRMENTS: Abnormal gait, decreased activity tolerance, decreased balance, decreased mobility, difficulty walking, decreased ROM, decreased strength, increased muscle spasms, impaired flexibility, improper body mechanics, and pain.   ACTIVITY LIMITATIONS: carrying, lifting, bending, sitting, standing, squatting, sleeping, stairs, transfers, bed mobility, and locomotion level  PARTICIPATION LIMITATIONS: meal prep, cleaning, laundry, driving, shopping, community activity, and yard work  PERSONAL FACTORS: Past/current experiences and Time since onset of injury/illness/exacerbation are also affecting patient's functional outcome.   REHAB POTENTIAL: Good  CLINICAL DECISION MAKING: Stable/uncomplicated  EVALUATION COMPLEXITY: Low   GOALS: SHORT TERM GOALS: Target date: 03/26/2022  Patient will be independent with initial HEP and self-management strategies to improve functional outcomes Baseline:  Goal status: MET    LONG TERM GOALS: Target date: 04/16/2022  Patient will be independent with advanced HEP and self-management strategies to improve functional outcomes Baseline:  Goal status: MET  2.  Patient will improve FOTO score to predicted value to indicate improvement in functional outcomes Baseline: 47% function  Goal status: NOT MET  3.  Patient will report reduction of back pain to <3/10 with activity (walking/ standing)  for improved quality of life and ability to  perform ADLs  Baseline: 7-8/10 Goal status: NOT MET  4. Patient will have equal to or > 4/5 MMT throughout BLE to improve ability to perform functional mobility, stair ambulation and ADLs.  Baseline: See MMT Goal status: NOT MET  5. Patient will be able to ambulate at least 325 feet during 2MWT with LRAD to demonstrate improved ability to perform functional mobility and associated tasks. Baseline: 280 feet with no AD  Goal status: MET  PLAN:  PT FREQUENCY: 1-2x/week  PT DURATION: 6 weeks  PLANNED INTERVENTIONS: Therapeutic exercises, Therapeutic activity, Neuromuscular re-education, Balance training, Gait training, Patient/Family education, Self Care, and Joint mobilization.  PLAN FOR NEXT SESSION: Discharge per patient request.  Goals partly met.   2:54 PM, 04/09/22 Teena Irani, PTA/CLT [place] Ph: [phone]

## 2022-04-23 DIAGNOSIS — Z1283 Encounter for screening for malignant neoplasm of skin: Secondary | ICD-10-CM | POA: Diagnosis not present

## 2022-04-23 DIAGNOSIS — D225 Melanocytic nevi of trunk: Secondary | ICD-10-CM | POA: Diagnosis not present

## 2022-04-23 DIAGNOSIS — B078 Other viral warts: Secondary | ICD-10-CM | POA: Diagnosis not present

## 2022-04-23 DIAGNOSIS — D485 Neoplasm of uncertain behavior of skin: Secondary | ICD-10-CM | POA: Diagnosis not present

## 2022-05-15 ENCOUNTER — Ambulatory Visit (INDEPENDENT_AMBULATORY_CARE_PROVIDER_SITE_OTHER): Payer: PPO | Admitting: Family Medicine

## 2022-05-15 VITALS — BP 139/88 | Ht 71.0 in | Wt 167.2 lb

## 2022-05-15 DIAGNOSIS — Z720 Tobacco use: Secondary | ICD-10-CM

## 2022-05-15 DIAGNOSIS — I10 Essential (primary) hypertension: Secondary | ICD-10-CM | POA: Diagnosis not present

## 2022-05-15 DIAGNOSIS — M4317 Spondylolisthesis, lumbosacral region: Secondary | ICD-10-CM | POA: Diagnosis not present

## 2022-05-15 DIAGNOSIS — M8000XA Age-related osteoporosis with current pathological fracture, unspecified site, initial encounter for fracture: Secondary | ICD-10-CM

## 2022-05-15 NOTE — Assessment & Plan Note (Signed)
Patient has chronic back pain.  We had a lengthy discussion today about this.  Patient will continue his current exercises and if he decides he would like another opinion from neurosurgery I am happy to place referral.

## 2022-05-15 NOTE — Assessment & Plan Note (Signed)
Stable.  Continue chlorthalidone, diltiazem, and losartan.

## 2022-05-15 NOTE — Assessment & Plan Note (Signed)
Continue Fosamax  

## 2022-05-15 NOTE — Patient Instructions (Signed)
Work on smoking cessation.  Continue your medications.  Call with concerns.  Follow up in 6 months.

## 2022-05-15 NOTE — Progress Notes (Signed)
Subjective:  Patient ID: Leonard Armstrong, male    DOB: 1955-03-15  Age: 67 y.o. MRN: [id number]  CC: Chief Complaint  Patient presents with   Follow-up    Follow up up on compression fracture s/p PT    HPI:  67 year old male presents for follow-up.  Patient has done PT and is doing home exercises now.  He states that he had some mild improvement.  He states that he is unsure of what to do at this point in time.  He is unsure whether he needs surgery or whether this would be in his best interest.  Patient's hypertension is well-controlled on losartan, chlorthalidone, and diltiazem.  Remains on Fosamax for osteoporosis.  Patient is COPD is stable.  He is trying to cut back on his smoking.   Patient Active Problem List   Diagnosis Date Noted   Compression fracture of L3 vertebra (HCC) 02/13/2022   Spondylolisthesis at L5-S1 level 02/13/2022   Cyst of skin 02/13/2022   Osteoporosis with current pathological fracture 10/15/2021   Alcohol abuse 08/14/2021   Tobacco abuse 08/14/2021   Preventative health care 08/14/2021   PSVT (paroxysmal supraventricular tachycardia) 08/13/2021   Pulmonary emphysema (HCC) 08/07/2021   HTN (hypertension) 06/08/2012    Social Hx   Social History   Socioeconomic History   Marital status: Married    Spouse name: Not on file   Number of children: Not on file   Years of education: Not on file   Highest education level: Not on file  Occupational History   Not on file  Tobacco Use   Smoking status: Every Day    Packs/day: 2    Types: Cigarettes    Start date: 01/20/1975   Smokeless tobacco: Former    Types: Chew   Tobacco comments:    Smokes 5 packs of cigarettes a weeks. 09/02/21 Tay  Substance and Sexual Activity   Alcohol use: Yes    Alcohol/week: 42.0 standard drinks of alcohol    Types: 42 Cans of beer per week   Drug use: No   Sexual activity: Yes    Partners: Female  Other Topics Concern   Not on file  Social History  Narrative   Not on file   Social Determinants of Health   Financial Resource Strain: Not on file  Food Insecurity: Not on file  Transportation Needs: Not on file  Physical Activity: Not on file  Stress: Not on file  Social Connections: Not on file    Review of Systems  Constitutional: Negative.   Musculoskeletal:  Positive for back pain.   Objective:  BP 139/88   Ht 5\' 11"  (1.803 m)   Wt 167 lb 3.2 oz (75.8 kg)   BMI 23.32 kg/m      05/15/2022    8:54 AM 05/15/2022    8:52 AM 03/24/2022    8:13 AM  BP/Weight  Systolic BP 139 152 134  Diastolic BP 88 83 82  Wt. (Lbs)  167.2 170.6  BMI  23.32 kg/m2 23.79 kg/m2    Physical Exam Vitals and nursing note reviewed.  Constitutional:      Appearance: Normal appearance.  HENT:     Head: Normocephalic and atraumatic.  Eyes:     General:        Right eye: No discharge.        Left eye: No discharge.     Conjunctiva/sclera: Conjunctivae normal.  Cardiovascular:     Rate and Rhythm: Normal rate and regular  rhythm.  Pulmonary:     Effort: Pulmonary effort is normal.     Breath sounds: Normal breath sounds. No wheezing, rhonchi or rales.  Neurological:     Mental Status: He is alert.  Psychiatric:        Mood and Affect: Mood normal.        Behavior: Behavior normal.     Lab Results  Component Value Date   WBC 9.7 02/20/2021   HGB 15.7 02/20/2021   HCT 44.0 02/20/2021   PLT 303 02/20/2021   GLUCOSE 108 (H) 02/20/2021   CHOL 145 02/20/2021   TRIG 40 02/20/2021   HDL 84 02/20/2021   LDLCALC 51 02/20/2021   ALT 15 02/20/2021   AST 23 02/20/2021   NA 131 (L) 02/20/2021   K 4.2 02/20/2021   CL 91 (L) 02/20/2021   CREATININE 0.72 (L) 02/20/2021   BUN 11 02/20/2021   CO2 25 02/20/2021   TSH 0.769 02/20/2021   HGBA1C 5.1 02/20/2021     Assessment & Plan:   Problem List Items Addressed This Visit       Cardiovascular and Mediastinum   HTN (hypertension) - Primary    Stable.  Continue chlorthalidone,  diltiazem, and losartan.        Musculoskeletal and Integument   Osteoporosis with current pathological fracture    Continue Fosamax.      Spondylolisthesis at L5-S1 level    Patient has chronic back pain.  We had a lengthy discussion today about this.  Patient will continue his current exercises and if he decides he would like another opinion from neurosurgery I am happy to place referral.        Other   Tobacco abuse    Patient is going to continue to work on cutting back.      Follow-up:  Return in about 6 months (around 11/15/2022).  Everlene Other DO [facility]

## 2022-05-15 NOTE — Assessment & Plan Note (Signed)
Patient is going to continue to work on cutting back.

## 2022-05-27 ENCOUNTER — Ambulatory Visit: Payer: PPO | Attending: Family Medicine | Admitting: Cardiology

## 2022-05-27 ENCOUNTER — Encounter: Payer: Self-pay | Admitting: Cardiology

## 2022-05-27 VITALS — BP 126/54 | HR 92 | Ht 71.0 in | Wt 164.0 lb

## 2022-05-27 DIAGNOSIS — R002 Palpitations: Secondary | ICD-10-CM

## 2022-05-27 DIAGNOSIS — Z79899 Other long term (current) drug therapy: Secondary | ICD-10-CM

## 2022-05-27 DIAGNOSIS — R7309 Other abnormal glucose: Secondary | ICD-10-CM

## 2022-05-27 DIAGNOSIS — I471 Supraventricular tachycardia, unspecified: Secondary | ICD-10-CM

## 2022-05-27 DIAGNOSIS — Z136 Encounter for screening for cardiovascular disorders: Secondary | ICD-10-CM

## 2022-05-27 DIAGNOSIS — I10 Essential (primary) hypertension: Secondary | ICD-10-CM | POA: Diagnosis not present

## 2022-05-27 DIAGNOSIS — Z13220 Encounter for screening for lipoid disorders: Secondary | ICD-10-CM

## 2022-05-27 MED ORDER — DILTIAZEM HCL 30 MG PO TABS: 30.0000 mg | ORAL_TABLET | Freq: Three times a day (TID) | ORAL | 0 refills | Status: AC | PRN

## 2022-05-27 NOTE — Patient Instructions (Addendum)
Medication Instructions:   Diltiazem 30mg  - take one tab every eight hours as needed for palpitations  Continue all other medications.     Labwork:  CMET, CBC, TSH, Mg, HgA1c, FLP - orders given today  Reminder:  Nothing to eat or drink after 12 midnight prior to labs.   Testing/Procedures:  Your physician has requested that you have an abdominal aorta duplex. During this test, an ultrasound is used to evaluate the aorta. Allow 30 minutes for this exam. Do not eat after midnight the day before and avoid carbonated beverages   Follow-Up:  Office will contact with results via phone, letter or mychart.    6 months - [place] office   Any Other Special Instructions Will Be Listed Below (If Applicable).   If you need a refill on your cardiac medications before your next appointment, please call your pharmacy.

## 2022-05-27 NOTE — Progress Notes (Signed)
Clinical Summary Leonard Armstrong is a 67 y.o.male seen today for follow up of the following medical rpoblems.    1.PSVT - no recent palpitations - compliant with meds. He is on dilt 180mg  daily, has dilt short acting prn     - compliant with meds -no recent palpitaitons.     2. HTN  Spironolactone was discontinued d/t lab results. His Na+ had decreased to 129. His Crt. was 0.97 and GFR 97.    - he is compliant with meds - home bp's 130s/80s   3. COPD - followed by pulmonary, stiolto ok from cardiac standpoint. Just monitor for any increase in palpitatons.      Past Medical History:  Diagnosis Date   Allergy    Back pain    Cancer (HCC)    Skin   Hypertension    Loss of smell    Tachycardia      Allergies  Allergen Reactions   Wasp Venom Swelling   Other Other (See Comments)    Steroids- not in right state of mind. Makes vital signs erratic.    Prednisone Other (See Comments)    Not in right state of mind, makes vital signs erratic.      Current Outpatient Medications  Medication Sig Dispense Refill   acetaminophen (TYLENOL) 500 MG tablet Take 1,000 mg by mouth every 6 (six) hours as needed for mild pain.     alendronate (FOSAMAX) 70 MG tablet Take 1 tablet (70 mg total) by mouth every 7 (seven) days. Take with a full glass of water on an empty stomach. 12 tablet 4   Azelastine HCl (ASTEPRO) 0.15 % SOLN Place 1 spray into the nose daily in the afternoon.     chlorthalidone (HYGROTON) 25 MG tablet Take 0.5 tablets (12.5 mg total) by mouth daily. 45 tablet 3   diltiazem (CARDIZEM CD) 300 MG 24 hr capsule TAKE 1 CAPSULE(300 MG) BY MOUTH DAILY 90 capsule 2   fluticasone (FLONASE) 50 MCG/ACT nasal spray Place 1 spray into both nostrils daily. 16 g 2   levalbuterol (XOPENEX HFA) 45 MCG/ACT inhaler Inhale 1-2 puffs into the lungs every 6 (six) hours as needed for wheezing or shortness of breath. 45 g 3   losartan (COZAAR) 100 MG tablet Take 1 tablet (100 mg total)  by mouth at bedtime. 90 tablet 2   tiotropium (SPIRIVA) 18 MCG inhalation capsule Place 18 mcg into inhaler and inhale daily.     No current facility-administered medications for this visit.     Past Surgical History:  Procedure Laterality Date   BACK SURGERY     HERNIA REPAIR     VASECTOMY       Allergies  Allergen Reactions   Wasp Venom Swelling   Other Other (See Comments)    Steroids- not in right state of mind. Makes vital signs erratic.    Prednisone Other (See Comments)    Not in right state of mind, makes vital signs erratic.       Family History  Problem Relation Age of Onset   Lung cancer Mother    Cancer Mother        lung   Hypertension Father    Cancer Father        father   Heart disease Other    Cancer Other        colon     Social History Mr. Tarman reports that he has been smoking cigarettes. He started smoking about  47 years ago. He has been smoking an average of 2 packs per day. He has quit using smokeless tobacco.  His smokeless tobacco use included chew. Mr. Demary reports current alcohol use of about 42.0 standard drinks of alcohol per week.   Review of Systems CONSTITUTIONAL: No weight loss, fever, chills, weakness or fatigue.  HEENT: Eyes: No visual loss, blurred vision, double vision or yellow sclerae.No hearing loss, sneezing, congestion, runny nose or sore throat.  SKIN: No rash or itching.  CARDIOVASCULAR: per hpi RESPIRATORY: No shortness of breath, cough or sputum.  GASTROINTESTINAL: No anorexia, nausea, vomiting or diarrhea. No abdominal pain or blood.  GENITOURINARY: No burning on urination, no polyuria NEUROLOGICAL: No headache, dizziness, syncope, paralysis, ataxia, numbness or tingling in the extremities. No change in bowel or bladder control.  MUSCULOSKELETAL: No muscle, back pain, joint pain or stiffness.  LYMPHATICS: No enlarged nodes. No history of splenectomy.  PSYCHIATRIC: No history of depression or anxiety.   ENDOCRINOLOGIC: No reports of sweating, cold or heat intolerance. No polyuria or polydipsia.  Marland Kitchen   Physical Examination Today's Vitals   05/27/22 1332  BP: (!) 126/54  Pulse: 92  SpO2: 96%  Weight: 164 lb (74.4 kg)  Height: 5\' 11"  (1.803 m)   Body mass index is 22.87 kg/m.  Gen: resting comfortably, no acute distress HEENT: no scleral icterus, pupils equal round and reactive, no palptable cervical adenopathy,  CV: RRR, no m/r,g no jvd Resp: Clear to auscultation bilaterally GI: abdomen is soft, non-tender, non-distended, normal bowel sounds, no hepatosplenomegaly MSK: extremities are warm, no edema.  Skin: warm, no rash Neuro:  no focal deficits Psych: appropriate affect   Diagnostic Studies  12/2013 echo Study Conclusions  - Left ventricle: The cavity size was normal. Wall thickness was   normal. Systolic function was normal. The estimated ejection   fraction was in the range of 60% to 65%. Wall motion was normal;   there were no regional wall motion abnormalities. Left   ventricular diastolic function parameters were normal. - Technically adequate study.   Jan 2016 MPI IMPRESSION: 1. Diaphragmatic attenuation noted without clear evidence of scar or significant ischemia.   2. Normal left ventricular wall motion.   3. Left ventricular ejection fraction 69%   4. Low-risk stress test findings*.   05/2019 nuclear stress No diagnostic ST segment changes to indicate ischemia. Small, mild intensity, inferior/inferoseptal defect that is fixed and most consistent with soft tissue attenuation. No large ischemic territories. This is a low risk study. Nuclear stress EF: 79%.       Assessment and Plan   1. PSVT -no symptoms, continue diltiazem. Given a Rx for short acting dilt 30mg  prn.  - ok for trial of stiolto inhaler as prescibed by pulmonary, just monitor any increased palpitations   2. HTN At goal, continue current meds  3. AAA screen - male 80 with  smoking history, plan for AAA screening US.   Update annual labs  Antoine Poche, M.D.

## 2022-05-28 ENCOUNTER — Other Ambulatory Visit: Payer: Self-pay | Admitting: Cardiology

## 2022-06-13 ENCOUNTER — Ambulatory Visit (HOSPITAL_COMMUNITY)
Admission: RE | Admit: 2022-06-13 | Discharge: 2022-06-13 | Disposition: A | Discharge status: Home / Self Care | Payer: PPO | Source: Ambulatory Visit | Attending: Cardiology | Admitting: Cardiology

## 2022-06-13 DIAGNOSIS — Z136 Encounter for screening for cardiovascular disorders: Secondary | ICD-10-CM

## 2022-06-13 DIAGNOSIS — Z87891 Personal history of nicotine dependence: Secondary | ICD-10-CM | POA: Insufficient documentation

## 2022-07-17 ENCOUNTER — Other Ambulatory Visit: Payer: Self-pay | Admitting: Cardiology

## 2022-07-17 ENCOUNTER — Telehealth: Payer: Self-pay | Admitting: Cardiology

## 2022-07-17 MED ORDER — DILTIAZEM HCL ER COATED BEADS 300 MG PO CP24: ORAL_CAPSULE | ORAL | 2 refills | Status: DC

## 2022-07-17 MED ORDER — LOSARTAN POTASSIUM 100 MG PO TABS: 100.0000 mg | ORAL_TABLET | Freq: Every day | ORAL | 2 refills | Status: DC

## 2022-07-17 MED ORDER — CHLORTHALIDONE 25 MG PO TABS: 12.5000 mg | ORAL_TABLET | Freq: Every day | ORAL | 3 refills | Status: DC

## 2022-07-17 NOTE — Telephone Encounter (Signed)
*  STAT* If patient is at the pharmacy, call can be transferred to refill team.   1. Which medications need to be refilled? (please list name of each medication and dose if known)   chlorthalidone (HYGROTON) 25 MG tablet  diltiazem (CARDIZEM CD) 300 MG 24 hr capsule  losartan (COZAAR) 100 MG tablet   2. Which pharmacy/location (including street and city if local pharmacy) is medication to be sent to?     [facility] [phone] - [place], [place] - [address] AT [facility] [address]   3. Do they need a 30 day or 90 day supply?   90 day  Patient stated he is almost out of these medications.

## 2022-07-17 NOTE — Telephone Encounter (Signed)
Medication refilled as requested.

## 2022-07-21 DIAGNOSIS — Z1283 Encounter for screening for malignant neoplasm of skin: Secondary | ICD-10-CM | POA: Diagnosis not present

## 2022-07-21 DIAGNOSIS — B078 Other viral warts: Secondary | ICD-10-CM | POA: Diagnosis not present

## 2022-07-21 DIAGNOSIS — D225 Melanocytic nevi of trunk: Secondary | ICD-10-CM | POA: Diagnosis not present

## 2022-07-21 DIAGNOSIS — B079 Viral wart, unspecified: Secondary | ICD-10-CM | POA: Diagnosis not present

## 2022-10-31 ENCOUNTER — Other Ambulatory Visit: Payer: Self-pay | Admitting: Family Medicine

## 2022-11-05 ENCOUNTER — Ambulatory Visit
Admission: EM | Admit: 2022-11-05 | Discharge: 2022-11-05 | Disposition: A | Discharge status: Home / Self Care | Payer: PPO | Attending: Nurse Practitioner | Admitting: Nurse Practitioner

## 2022-11-05 ENCOUNTER — Other Ambulatory Visit: Payer: Self-pay

## 2022-11-05 ENCOUNTER — Encounter: Payer: Self-pay | Admitting: Emergency Medicine

## 2022-11-05 ENCOUNTER — Ambulatory Visit: Payer: PPO

## 2022-11-05 DIAGNOSIS — J069 Acute upper respiratory infection, unspecified: Secondary | ICD-10-CM | POA: Insufficient documentation

## 2022-11-05 DIAGNOSIS — J441 Chronic obstructive pulmonary disease with (acute) exacerbation: Secondary | ICD-10-CM | POA: Diagnosis not present

## 2022-11-05 DIAGNOSIS — R059 Cough, unspecified: Secondary | ICD-10-CM | POA: Diagnosis not present

## 2022-11-05 DIAGNOSIS — R062 Wheezing: Secondary | ICD-10-CM | POA: Diagnosis not present

## 2022-11-05 DIAGNOSIS — Z1152 Encounter for screening for COVID-19: Secondary | ICD-10-CM | POA: Insufficient documentation

## 2022-11-05 MED ORDER — CETIRIZINE HCL 10 MG PO TABS: 10.0000 mg | ORAL_TABLET | Freq: Every day | ORAL | 0 refills | Status: DC

## 2022-11-05 MED ORDER — IPRATROPIUM BROMIDE 0.02 % IN SOLN
0.5000 mg | Freq: Once | RESPIRATORY_TRACT | Status: AC
  Administered 2022-11-05: 0.5 mg via RESPIRATORY_TRACT

## 2022-11-05 MED ORDER — PROMETHAZINE-DM 6.25-15 MG/5ML PO SYRP: 5.0000 mL | ORAL_SOLUTION | Freq: Four times a day (QID) | ORAL | 0 refills | Status: DC | PRN

## 2022-11-05 MED ORDER — DOXYCYCLINE HYCLATE 100 MG PO TABS: 100.0000 mg | ORAL_TABLET | Freq: Two times a day (BID) | ORAL | 0 refills | Status: AC

## 2022-11-05 MED ORDER — LEVALBUTEROL TARTRATE 45 MCG/ACT IN AERO: 1.0000 | INHALATION_SPRAY | RESPIRATORY_TRACT | 0 refills | Status: DC | PRN

## 2022-11-05 NOTE — Discharge Instructions (Addendum)
Your chest x-ray result is pending.  You will be contacted once the chest x-ray result is received. Take medication as prescribed. Increase fluids and allow for plenty of rest. Continue Tylenol as needed for pain, fever, or general discomfort. Keep scheduled appointment with Dr. Adriana Simas. Go to the emergency department if you experience worsening shortness of breath, difficulty breathing, or become unable to speak in a complete sentence. Follow-up as needed.

## 2022-11-05 NOTE — ED Triage Notes (Signed)
Pt reports cough, right ear fullness, intermittent fever,nasal congestion since Sunday.   Dyspnea noted with exertion. Pt able to speak clear complete symptoms. Reports hx of copd and emphysema.

## 2022-11-05 NOTE — ED Provider Notes (Signed)
[facility]    CSN: [id number] Arrival date & time: 11/05/22  0806      History   Chief Complaint Chief Complaint  Patient presents with   Cough    HPI Leonard Armstrong is a 67 y.o. male.   The history is provided by the patient.   Patient presents for complaints of fever, nasal congestion, right ear fullness, cough, wheezing, and shortness of breath with exertion that is been present for the past several days.  Patient reports last fever was 1 day ago.  Patient denies headache, chest pain, abdominal pain, nausea, vomiting, diarrhea, or rash.  Patient reports he does have a history of COPD and emphysema.  Reports that he continues to smoke, continues to smoke at least half pack of cigarettes per day.  He reports he has been smoking since his symptoms started.  Patient reports that he has been using Mucinex for symptoms.  Has been using Stiolto inhaler since symptoms started, states that he does uses as prescribed for his COPD.  Patient denies any obvious known sick contacts.  Patient with prior history of SVT, reports he was informed by cardiology that he should avoid use of that medication, also reports history of allergy to prednisone which disrupts his breathing.  Past Medical History:  Diagnosis Date   Allergy    Back pain    Cancer (HCC)    Skin   Hypertension    Loss of smell    Tachycardia     Patient Active Problem List   Diagnosis Date Noted   Compression fracture of L3 vertebra (HCC) 02/13/2022   Spondylolisthesis at L5-S1 level 02/13/2022   Cyst of skin 02/13/2022   Osteoporosis with current pathological fracture 10/15/2021   Alcohol abuse 08/14/2021   Tobacco abuse 08/14/2021   Preventative health care 08/14/2021   PSVT (paroxysmal supraventricular tachycardia) (HCC) 08/13/2021   Pulmonary emphysema (HCC) 08/07/2021   HTN (hypertension) 06/08/2012    Past Surgical History:  Procedure Laterality Date   BACK SURGERY     HERNIA REPAIR      VASECTOMY         Home Medications    Prior to Admission medications   Medication Sig Start Date End Date Taking? Authorizing Provider  cetirizine (ZYRTEC) 10 MG tablet Take 1 tablet (10 mg total) by mouth daily. 11/05/22  Yes Leath-Warren, Sadie Haber, NP  doxycycline (VIBRA-TABS) 100 MG tablet Take 1 tablet (100 mg total) by mouth 2 (two) times daily for 5 days. 11/05/22 11/10/22 Yes Leath-Warren, Sadie Haber, NP  levalbuterol [facility] HFA) 45 MCG/ACT inhaler Inhale 1-2 puffs into the lungs every 4 (four) hours as needed for wheezing or shortness of breath. 11/05/22  Yes Leath-Warren, Sadie Haber, NP  promethazine-dextromethorphan (PROMETHAZINE-DM) 6.25-15 MG/5ML syrup Take 5 mLs by mouth 4 (four) times daily as needed. 11/05/22  Yes Leath-Warren, Sadie Haber, NP  acetaminophen (TYLENOL) 500 MG tablet Take 1,000 mg by mouth every 6 (six) hours as needed for mild pain.    [provider]  alendronate (FOSAMAX) 70 MG tablet TAKE 1 TABLET(70 MG) BY MOUTH EVERY 7 DAYS WITH A FULL GLASS OF WATER AND ON AN EMPTY STOMACH 10/31/22   Everlene Other G, DO  Azelastine HCl (ASTEPRO) 0.15 % SOLN Place 1 spray into the nose daily in the afternoon. 03/24/22   Coralyn Helling, MD  chlorthalidone (HYGROTON) 25 MG tablet Take 0.5 tablets (12.5 mg total) by mouth daily. 07/17/22   Antoine Poche, MD  diltiazem (CARDIZEM  CD) 300 MG 24 hr capsule TAKE 1 CAPSULE(300 MG) BY MOUTH DAILY 07/17/22   Antoine Poche, MD  diltiazem (CARDIZEM) 30 MG tablet Take 1 tablet (30 mg total) by mouth every 8 (eight) hours as needed (palpitations). 05/27/22   Antoine Poche, MD  fluticasone (FLONASE) 50 MCG/ACT nasal spray Place 1 spray into both nostrils daily. 03/24/22   Coralyn Helling, MD  HYDROcodone-acetaminophen (NORCO) 10-325 MG tablet Take 1 tablet every 4-6 hours by oral route. 07/19/21   [provider]  losartan (COZAAR) 100 MG tablet Take 1 tablet (100 mg total) by mouth at bedtime. 07/17/22   Antoine Poche, MD  tiotropium (SPIRIVA) 18 MCG inhalation capsule Place 18 mcg into inhaler and inhale daily.    [provider]    Family History Family History  Problem Relation Age of Onset   Lung cancer Mother    Cancer Mother        lung   Hypertension Father    Cancer Father        father   Heart disease Other    Cancer Other        colon    Social History Social History   Tobacco Use   Smoking status: Every Day    Current packs/day: 0.50    Average packs/day: 1.9 packs/day for 47.8 years (92.8 ttl pk-yrs)    Types: Cigarettes    Start date: 01/20/1975   Smokeless tobacco: Former    Types: Chew   Tobacco comments:    Smokes 5 packs of cigarettes a weeks. 09/02/21 Tay  Substance Use Topics   Alcohol use: Yes    Alcohol/week: 42.0 standard drinks of alcohol    Types: 42 Cans of beer per week   Drug use: No     Allergies   Wasp venom, Other, and Prednisone   Review of Systems Review of Systems Per HPI  Physical Exam Triage Vital Signs ED Triage Vitals [11/05/22 0828]  Encounter Vitals Group     BP 135/70     Systolic BP Percentile      Diastolic BP Percentile      Pulse Rate 82     Resp (!) 24     Temp 99.2 F (37.3 C)     Temp Source Oral     SpO2 99 %     Weight      Height      Head Circumference      Peak Flow      Pain Score 2     Pain Loc      Pain Education      Exclude from Growth Chart    No data found.  Updated Vital Signs BP 135/70 (BP Location: Right Arm)   Pulse 93   Temp 99.2 F (37.3 C) (Oral)   Resp (!) 22   SpO2 100% Comment: post breathing tx.  Visual Acuity Right Eye Distance:   Left Eye Distance:   Bilateral Distance:    Right Eye Near:   Left Eye Near:    Bilateral Near:     Physical Exam Vitals and nursing note reviewed.  Constitutional:      General: He is not in acute distress.    Appearance: Normal appearance.  HENT:     Head: Normocephalic.     Right Ear: Tympanic membrane, ear canal and  external ear normal.     Left Ear: Tympanic membrane, ear canal and external ear normal.  Nose: Congestion present.     Right Turbinates: Enlarged and swollen.     Left Turbinates: Enlarged and swollen.     Right Sinus: No maxillary sinus tenderness or frontal sinus tenderness.     Left Sinus: No maxillary sinus tenderness or frontal sinus tenderness.     Mouth/Throat:     Lips: Pink.     Mouth: Mucous membranes are moist.     Pharynx: Oropharynx is clear. Uvula midline. Posterior oropharyngeal erythema and postnasal drip present.     Comments: Cobblestoning present to posterior oropharynx  Eyes:     Extraocular Movements: Extraocular movements intact.     Conjunctiva/sclera: Conjunctivae normal.     Pupils: Pupils are equal, round, and reactive to light.  Cardiovascular:     Rate and Rhythm: Normal rate and regular rhythm.     Pulses: Normal pulses.     Heart sounds: Normal heart sounds.  Pulmonary:     Effort: Pulmonary effort is normal.     Breath sounds: Wheezing (expiratory wheezing noted in all lung fields) present.     Comments: Ipratropium nebulizer administered.  Post nebulizer treatment, patient with decreased wheezing noted throughout.  Faint wheezing noted in the posterior left lower lobe.  Patient reports improvement in breathing. Abdominal:     General: Bowel sounds are normal.     Palpations: Abdomen is soft.     Tenderness: There is no abdominal tenderness.  Musculoskeletal:     Cervical back: Normal range of motion.  Lymphadenopathy:     Cervical: No cervical adenopathy.  Skin:    General: Skin is warm and dry.  Neurological:     General: No focal deficit present.     Mental Status: He is alert and oriented to person, place, and time.  Psychiatric:        Mood and Affect: Mood normal.        Behavior: Behavior normal.      UC Treatments / Results  Labs (all labs ordered are listed, but only abnormal results are displayed) Labs Reviewed  SARS  CORONAVIRUS 2 (TAT 6-24 HRS)    EKG   Radiology DG Chest 2 View  Result Date: 11/05/2022 CLINICAL DATA:  Cough, wheezing. EXAM: CHEST - 2 VIEW COMPARISON:  July 27, 2021. FINDINGS: The heart size and mediastinal contours are within normal limits. Both lungs are clear. Hyperexpansion of the lungs is noted. The visualized skeletal structures are unremarkable. IMPRESSION: No active cardiopulmonary disease. Electronically Signed   By: Lupita Raider M.D.   On: 11/05/2022 09:59    Procedures Procedures (including critical care time)  Medications Ordered in UC Medications  ipratropium (ATROVENT) nebulizer solution 0.5 mg (0.5 mg Nebulization Given 11/05/22 0852)    Initial Impression / Assessment and Plan / UC Course  I have reviewed the triage vital signs and the nursing notes.  Pertinent labs & imaging results that were available during my care of the patient were reviewed by me and considered in my medical decision making (see chart for details).  Chest x-ray result was received, negative for active cardiopulmonary disease.  COVID test is pending.  Patient is able to receive Paxlovid if his COVID test is positive.  Given patient's underlying history of COPD, will treat for COPD exacerbation with Xopenex inhaler, and doxycycline 100 mg twice daily for the next 5 days.  Will start patient on cetirizine 10 mg for nasal congestion and runny nose.  Do suspect patient does have underlying viral upper respiratory infection.  Supportive care recommendations were provided and discussed with the patient to include continuing Tylenol, increasing fluids, and allowing for plenty of rest.  Patient was advised to follow-up with his PCP as scheduled.  Patient was given strict ER follow-up precautions.  Patient is in agreement with this plan of care and verbalizes understanding.  All questions were answered.  Patient stable for discharge.  Final Clinical Impressions(s) / UC Diagnoses   Final diagnoses:   Acute upper respiratory infection  COPD exacerbation (HCC)  Encounter for screening for COVID-19     Discharge Instructions      Your chest x-ray result is pending.  You will be contacted once the chest x-ray result is received. Take medication as prescribed. Increase fluids and allow for plenty of rest. Continue Tylenol as needed for pain, fever, or general discomfort. Keep scheduled appointment with Dr. Adriana Simas. Go to the emergency department if you experience worsening shortness of breath, difficulty breathing, or become unable to speak in a complete sentence. Follow-up as needed.     ED Prescriptions     Medication Sig Dispense Auth. Provider   doxycycline (VIBRA-TABS) 100 MG tablet Take 1 tablet (100 mg total) by mouth 2 (two) times daily for 5 days. 10 tablet Leath-Warren, Sadie Haber, NP   levalbuterol [facility] HFA) 45 MCG/ACT inhaler Inhale 1-2 puffs into the lungs every 4 (four) hours as needed for wheezing or shortness of breath. 1 each Leath-Warren, Sadie Haber, NP   promethazine-dextromethorphan (PROMETHAZINE-DM) 6.25-15 MG/5ML syrup Take 5 mLs by mouth 4 (four) times daily as needed. 118 mL Leath-Warren, Sadie Haber, NP   cetirizine (ZYRTEC) 10 MG tablet Take 1 tablet (10 mg total) by mouth daily. 30 tablet Leath-Warren, Sadie Haber, NP      PDMP not reviewed this encounter.   Abran Cantor, NP 11/05/22 1057

## 2022-11-06 LAB — SARS CORONAVIRUS 2 (TAT 6-24 HRS): SARS Coronavirus 2: NEGATIVE

## 2022-11-17 ENCOUNTER — Encounter: Payer: Self-pay | Admitting: *Deleted

## 2022-11-17 ENCOUNTER — Ambulatory Visit (INDEPENDENT_AMBULATORY_CARE_PROVIDER_SITE_OTHER): Payer: PPO | Admitting: Family Medicine

## 2022-11-17 VITALS — BP 124/70 | HR 101 | Temp 98.1°F | Ht 71.0 in | Wt 172.2 lb

## 2022-11-17 DIAGNOSIS — M8000XD Age-related osteoporosis with current pathological fracture, unspecified site, subsequent encounter for fracture with routine healing: Secondary | ICD-10-CM

## 2022-11-17 DIAGNOSIS — Z13220 Encounter for screening for lipoid disorders: Secondary | ICD-10-CM | POA: Diagnosis not present

## 2022-11-17 DIAGNOSIS — Z23 Encounter for immunization: Secondary | ICD-10-CM

## 2022-11-17 DIAGNOSIS — I10 Essential (primary) hypertension: Secondary | ICD-10-CM

## 2022-11-17 DIAGNOSIS — Z720 Tobacco use: Secondary | ICD-10-CM | POA: Diagnosis not present

## 2022-11-17 DIAGNOSIS — Z1211 Encounter for screening for malignant neoplasm of colon: Secondary | ICD-10-CM

## 2022-11-17 DIAGNOSIS — Z125 Encounter for screening for malignant neoplasm of prostate: Secondary | ICD-10-CM | POA: Diagnosis not present

## 2022-11-17 DIAGNOSIS — D7589 Other specified diseases of blood and blood-forming organs: Secondary | ICD-10-CM | POA: Diagnosis not present

## 2022-11-17 MED ORDER — DILTIAZEM HCL ER COATED BEADS 300 MG PO CP24: ORAL_CAPSULE | ORAL | 2 refills | Status: DC

## 2022-11-17 MED ORDER — CHLORTHALIDONE 25 MG PO TABS: 12.5000 mg | ORAL_TABLET | Freq: Every day | ORAL | 3 refills | Status: DC

## 2022-11-17 MED ORDER — LOSARTAN POTASSIUM 100 MG PO TABS: 100.0000 mg | ORAL_TABLET | Freq: Every day | ORAL | 2 refills | Status: DC

## 2022-11-17 NOTE — Progress Notes (Signed)
Subjective:  Patient ID: Leonard Armstrong, Leonard Armstrong    DOB: September 20, 1955  Age: 67 y.o. MRN: [id number]  CC:  Follow up    HPI:  67 year old Leonard Armstrong presents for follow-up.  Blood pressure is well-controlled here today.  He is on chlorthalidone, diltiazem, losartan.  Patient continues to have back trouble.  Has osteoporosis as well.  Mild Fosamax.  Patient wants his flu vaccine today.  Needs labs.  Patient continues to smoke.  Will discuss CT lung cancer screening today.  Patient amenable to referral for colonoscopy.  Additionally, patient has a cyst on his back.  He states that he is would like this removed.  He has a dermatologist.  Patient Active Problem List   Diagnosis Date Noted   Compression fracture of L3 vertebra (HCC) 02/13/2022   Spondylolisthesis at L5-S1 level 02/13/2022   Osteoporosis with current pathological fracture 10/15/2021   Alcohol abuse 08/14/2021   Tobacco abuse 08/14/2021   Preventative health care 08/14/2021   PSVT (paroxysmal supraventricular tachycardia) (HCC) 08/13/2021   Pulmonary emphysema (HCC) 08/07/2021   HTN (hypertension) 06/08/2012    Social Hx   Social History   Socioeconomic History   Marital status: Married    Spouse name: Not on file   Number of children: Not on file   Years of education: Not on file   Highest education level: Not on file  Occupational History   Not on file  Tobacco Use   Smoking status: Every Day    Current packs/day: 0.50    Average packs/day: 1.9 packs/day for 47.8 years (92.9 ttl pk-yrs)    Types: Cigarettes    Start date: 01/20/1975   Smokeless tobacco: Former    Types: Chew   Tobacco comments:    Smokes 5 packs of cigarettes a weeks. 09/02/21 Tay  Substance and Sexual Activity   Alcohol use: Yes    Alcohol/week: 42.0 standard drinks of alcohol    Types: 42 Cans of beer per week   Drug use: No   Sexual activity: Yes    Partners: Female  Other Topics Concern   Not on file  Social History Narrative    Not on file   Social Determinants of Health   Financial Resource Strain: Not on file  Food Insecurity: Not on file  Transportation Needs: Not on file  Physical Activity: Not on file  Stress: Not on file  Social Connections: Not on file    Review of Systems Per HPI  Objective:  BP 124/70   Pulse (!) 101   Temp 98.1 F (36.7 C)   Ht 5\' 11"  (1.803 m)   Wt 172 lb 3.2 oz (78.1 kg)   SpO2 97%   BMI 24.02 kg/m      11/17/2022    9:14 AM 11/17/2022    8:31 AM 11/05/2022    8:28 AM  BP/Weight  Systolic BP 124 144 135  Diastolic BP 70 76 70  Wt. (Lbs)  172.2   BMI  24.02 kg/m2     Physical Exam Vitals and nursing note reviewed.  Constitutional:      General: He is not in acute distress.    Appearance: Normal appearance.  HENT:     Head: Normocephalic and atraumatic.  Cardiovascular:     Rate and Rhythm: Normal rate and regular rhythm.  Pulmonary:     Effort: Pulmonary effort is normal.     Breath sounds: Normal breath sounds. No wheezing, rhonchi or rales.  Skin:    Comments:  Upper back with a 5 x 4 cm raised, soft area.  Neurological:     Mental Status: He is alert.  Psychiatric:        Mood and Affect: Mood normal.        Behavior: Behavior normal.     Lab Results  Component Value Date   WBC 9.7 02/20/2021   HGB 15.7 02/20/2021   HCT 44.0 02/20/2021   PLT 303 02/20/2021   GLUCOSE 108 (H) 02/20/2021   CHOL 145 02/20/2021   TRIG 40 02/20/2021   HDL 84 02/20/2021   LDLCALC 51 02/20/2021   ALT 15 02/20/2021   AST 23 02/20/2021   NA 131 (L) 02/20/2021   K 4.2 02/20/2021   CL 91 (L) 02/20/2021   CREATININE 0.72 (L) 02/20/2021   BUN 11 02/20/2021   CO2 25 02/20/2021   TSH 0.769 02/20/2021   HGBA1C 5.1 02/20/2021     Assessment & Plan:   Problem List Items Addressed This Visit       Cardiovascular and Mediastinum   HTN (hypertension) - Primary    Stable.  Continue current medications.      Relevant Medications   losartan (COZAAR) 100 MG  tablet   diltiazem (CARDIZEM CD) 300 MG 24 hr capsule   chlorthalidone (HYGROTON) 25 MG tablet   Other Relevant Orders   CMP14+EGFR   Microalbumin / creatinine urine ratio     Musculoskeletal and Integument   Osteoporosis with current pathological fracture    Continue Fosamax.        Other   Tobacco abuse    Recommended CT lung cancer screening.  He will consider.      Other Visit Diagnoses     Encounter for screening colonoscopy       Relevant Orders   Ambulatory referral to Gastroenterology   Macrocytosis       Relevant Orders   CBC   Screening, lipid       Relevant Orders   Lipid panel   Prostate cancer screening       Relevant Orders   PSA   Immunization due       Relevant Orders   Flu Vaccine Trivalent High Dose (Fluad) (Completed)       Meds ordered this encounter  Medications   losartan (COZAAR) 100 MG tablet    Sig: Take 1 tablet (100 mg total) by mouth at bedtime.    Dispense:  90 tablet    Refill:  2   diltiazem (CARDIZEM CD) 300 MG 24 hr capsule    Sig: TAKE 1 CAPSULE(300 MG) BY MOUTH DAILY    Dispense:  90 capsule    Refill:  2   chlorthalidone (HYGROTON) 25 MG tablet    Sig: Take 0.5 tablets (12.5 mg total) by mouth daily.    Dispense:  45 tablet    Refill:  3    Follow-up:  Return in about 6 months (around 05/17/2023).  Everlene Other DO [facility]

## 2022-11-17 NOTE — Assessment & Plan Note (Signed)
Stable.  Continue current medications.

## 2022-11-17 NOTE — Assessment & Plan Note (Signed)
Continue Fosamax  

## 2022-11-17 NOTE — Patient Instructions (Signed)
Labs ordered.  Consider CT lung cancer screening.  Follow up in 6 months.  Take care  Dr. Adriana Simas

## 2022-11-17 NOTE — Assessment & Plan Note (Signed)
Recommended CT lung cancer screening.  He will consider.

## 2022-11-19 DIAGNOSIS — D7589 Other specified diseases of blood and blood-forming organs: Secondary | ICD-10-CM | POA: Diagnosis not present

## 2022-11-19 DIAGNOSIS — I10 Essential (primary) hypertension: Secondary | ICD-10-CM | POA: Diagnosis not present

## 2022-11-19 DIAGNOSIS — Z13220 Encounter for screening for lipoid disorders: Secondary | ICD-10-CM | POA: Diagnosis not present

## 2022-11-19 DIAGNOSIS — Z125 Encounter for screening for malignant neoplasm of prostate: Secondary | ICD-10-CM | POA: Diagnosis not present

## 2022-11-20 LAB — CBC
Hematocrit: 41.6 % (ref 37.5–51.0)
Hemoglobin: 14.1 g/dL (ref 13.0–17.7)
MCH: 33.7 pg — ABNORMAL HIGH (ref 26.6–33.0)
MCHC: 33.9 g/dL (ref 31.5–35.7)
MCV: 99 fL — ABNORMAL HIGH (ref 79–97)
Platelets: 332 10*3/uL (ref 150–450)
RBC: 4.19 x10E6/uL (ref 4.14–5.80)
RDW: 12.1 % (ref 11.6–15.4)
WBC: 7 10*3/uL (ref 3.4–10.8)

## 2022-11-20 LAB — MICROALBUMIN / CREATININE URINE RATIO
Creatinine, Urine: 30.5 mg/dL
Microalb/Creat Ratio: <10 mg/g{creat} (ref 0–29)
Microalbumin, Urine: <3 ug/mL

## 2022-11-20 LAB — CMP14+EGFR
ALT: 38 [IU]/L (ref 0–44)
AST: 27 [IU]/L (ref 0–40)
Albumin: 4.5 g/dL (ref 3.9–4.9)
Alkaline Phosphatase: 86 [IU]/L (ref 44–121)
BUN/Creatinine Ratio: 12 (ref 10–24)
BUN: 8 mg/dL (ref 8–27)
Bilirubin Total: 0.5 mg/dL (ref 0.0–1.2)
CO2: 25 mmol/L (ref 20–29)
Calcium: 10 mg/dL (ref 8.6–10.2)
Chloride: 91 mmol/L — ABNORMAL LOW (ref 96–106)
Creatinine, Ser: 0.67 mg/dL — ABNORMAL LOW (ref 0.76–1.27)
Globulin, Total: 2.9 g/dL (ref 1.5–4.5)
Glucose: 95 mg/dL (ref 70–99)
Potassium: 4.2 mmol/L (ref 3.5–5.2)
Sodium: 130 mmol/L — ABNORMAL LOW (ref 134–144)
Total Protein: 7.4 g/dL (ref 6.0–8.5)
eGFR: 104 mL/min/{1.73_m2} (ref >59)

## 2022-11-20 LAB — LIPID PANEL
Chol/HDL Ratio: 1.7 ratio (ref 0.0–5.0)
Cholesterol, Total: 169 mg/dL (ref 100–199)
HDL: 100 mg/dL (ref >39)
LDL Chol Calc (NIH): 59 mg/dL (ref 0–99)
Triglycerides: 49 mg/dL (ref 0–149)
VLDL Cholesterol Cal: 10 mg/dL (ref 5–40)

## 2022-11-20 LAB — PSA: Prostate Specific Ag, Serum: 2.2 ng/mL (ref 0.0–4.0)

## 2022-11-21 ENCOUNTER — Encounter: Payer: Self-pay | Admitting: Cardiology

## 2022-11-21 ENCOUNTER — Ambulatory Visit: Payer: PPO | Attending: Cardiology | Admitting: Cardiology

## 2022-11-21 VITALS — BP 136/78 | HR 96 | Ht 71.0 in | Wt 175.0 lb

## 2022-11-21 DIAGNOSIS — I471 Supraventricular tachycardia, unspecified: Secondary | ICD-10-CM | POA: Diagnosis not present

## 2022-11-21 DIAGNOSIS — I10 Essential (primary) hypertension: Secondary | ICD-10-CM

## 2022-11-21 NOTE — Progress Notes (Signed)
Clinical Summary Mr. Repke is a 67 y.o.male seen today for follow up of the following medical rpoblems.    1.PSVT - no recent palpitations - compliant with meds    2. HTN  Spironolactone was discontinued d/t lab results. His Na+ had decreased to 129. His Crt. was 0.97 and GFR 97.   Compliant with meds Recent pcp visit 124/70     3. COPD - followed by pulmonary, stiolto ok from cardiac standpoint. Just monitor for any increase in palpitatons.      11/2022 TC 169 TG 49 HDL 100 LDL 59 Past Medical History:  Diagnosis Date   Allergy    Back pain    Cancer (HCC)    Skin   COPD (chronic obstructive pulmonary disease) (HCC)    Hypertension    Loss of smell    Tachycardia      Allergies  Allergen Reactions   Wasp Venom Swelling   Other Other (See Comments)    Steroids- not in right state of mind. Makes vital signs erratic.    Prednisone Other (See Comments)    Not in right state of mind, makes vital signs erratic.      Current Outpatient Medications  Medication Sig Dispense Refill   acetaminophen (TYLENOL) 500 MG tablet Take 1,000 mg by mouth every 6 (six) hours as needed for mild pain.     alendronate (FOSAMAX) 70 MG tablet TAKE 1 TABLET(70 MG) BY MOUTH EVERY 7 DAYS WITH A FULL GLASS OF WATER AND ON AN EMPTY STOMACH 12 tablet 4   Azelastine HCl (ASTEPRO) 0.15 % SOLN Place 1 spray into the nose daily in the afternoon.     cetirizine (ZYRTEC) 10 MG tablet Take 1 tablet (10 mg total) by mouth daily. 30 tablet 0   chlorthalidone (HYGROTON) 25 MG tablet Take 0.5 tablets (12.5 mg total) by mouth daily. 45 tablet 3   diltiazem (CARDIZEM CD) 300 MG 24 hr capsule TAKE 1 CAPSULE(300 MG) BY MOUTH DAILY 90 capsule 2   diltiazem (CARDIZEM) 30 MG tablet Take 1 tablet (30 mg total) by mouth every 8 (eight) hours as needed (palpitations). 10 tablet 0   fluticasone (FLONASE) 50 MCG/ACT nasal spray Place 1 spray into both nostrils daily. 16 g 2   HYDROcodone-acetaminophen (NORCO)  10-325 MG tablet Take 1 tablet every 4-6 hours by oral route.     levalbuterol (XOPENEX HFA) 45 MCG/ACT inhaler Inhale 1-2 puffs into the lungs every 4 (four) hours as needed for wheezing or shortness of breath. 1 each 0   losartan (COZAAR) 100 MG tablet Take 1 tablet (100 mg total) by mouth at bedtime. 90 tablet 2   STIOLTO RESPIMAT 2.5-2.5 MCG/ACT AERS SMARTSIG:2 Puff(s) Via Inhaler Daily     No current facility-administered medications for this visit.     Past Surgical History:  Procedure Laterality Date   BACK SURGERY     HERNIA REPAIR     VASECTOMY       Allergies  Allergen Reactions   Wasp Venom Swelling   Other Other (See Comments)    Steroids- not in right state of mind. Makes vital signs erratic.    Prednisone Other (See Comments)    Not in right state of mind, makes vital signs erratic.       Family History  Problem Relation Age of Onset   Lung cancer Mother    Cancer Mother        lung   Hypertension Father  Cancer Father        father   Heart disease Other    Cancer Other        colon     Social History Mr. Horkey reports that he has been smoking cigarettes. He started smoking about 47 years ago. He has a 92.9 pack-year smoking history. He has quit using smokeless tobacco.  His smokeless tobacco use included chew. Mr. Anastacio reports current alcohol use of about 42.0 standard drinks of alcohol per week.   Review of Systems CONSTITUTIONAL: No weight loss, fever, chills, weakness or fatigue.  HEENT: Eyes: No visual loss, blurred vision, double vision or yellow sclerae.No hearing loss, sneezing, congestion, runny nose or sore throat.  SKIN: No rash or itching.  CARDIOVASCULAR: per hpi RESPIRATORY: No shortness of breath, cough or sputum.  GASTROINTESTINAL: No anorexia, nausea, vomiting or diarrhea. No abdominal pain or blood.  GENITOURINARY: No burning on urination, no polyuria NEUROLOGICAL: No headache, dizziness, syncope, paralysis, ataxia, numbness or  tingling in the extremities. No change in bowel or bladder control.  MUSCULOSKELETAL: No muscle, back pain, joint pain or stiffness.  LYMPHATICS: No enlarged nodes. No history of splenectomy.  PSYCHIATRIC: No history of depression or anxiety.  ENDOCRINOLOGIC: No reports of sweating, cold or heat intolerance. No polyuria or polydipsia.  Marland Kitchen   Physical Examination Vitals:   11/21/22 0922 11/21/22 0951  BP: (!) 146/76 136/78  Pulse: 96   SpO2: 96%    Filed Weights   11/21/22 0922  Weight: 175 lb (79.4 kg)    Gen: resting comfortably, no acute distress HEENT: no scleral icterus, pupils equal round and reactive, no palptable cervical adenopathy,  CV: RRR, no m/rg, no jvd Resp: Clear to auscultation bilaterally GI: abdomen is soft, non-tender, non-distended, normal bowel sounds, no hepatosplenomegaly MSK: extremities are warm, no edema.  Skin: warm, no rash Neuro:  no focal deficits Psych: appropriate affect   Diagnostic Studies   12/2013 echo Study Conclusions  - Left ventricle: The cavity size was normal. Wall thickness was   normal. Systolic function was normal. The estimated ejection   fraction was in the range of 60% to 65%. Wall motion was normal;   there were no regional wall motion abnormalities. Left   ventricular diastolic function parameters were normal. - Technically adequate study.   Jan 2016 MPI IMPRESSION: 1. Diaphragmatic attenuation noted without clear evidence of scar or significant ischemia.   2. Normal left ventricular wall motion.   3. Left ventricular ejection fraction 69%   4. Low-risk stress test findings*.   05/2019 nuclear stress No diagnostic ST segment changes to indicate ischemia. Small, mild intensity, inferior/inferoseptal defect that is fixed and most consistent with soft tissue attenuation. No large ischemic territories. This is a low risk study. Nuclear stress EF: 79%.  06/2022 AAA [place]: no aneurysm  Assessment and Plan   1.  PSVT -doing well without symptoms, continue current meds - EKG today shows NSR   2. HTN -essentially at goal, continue current meds    F/u 1 year   Antoine Poche, M.D.,

## 2022-11-21 NOTE — Patient Instructions (Signed)
Medication Instructions:  Your physician recommends that you continue on your current medications as directed. Please refer to the Current Medication list given to you today.  *If you need a refill on your cardiac medications before your next appointment, please call your pharmacy*   Lab Work: None  If you have labs (blood work) drawn today and your tests are completely normal, you will receive your results only by: MyChart Message (if you have MyChart) OR A paper copy in the mail If you have any lab test that is abnormal or we need to change your treatment, we will call you to review the results.   Testing/Procedures: None   Follow-Up: At [facility], you and your health needs are our priority.  As part of our continuing mission to provide you with exceptional heart care, we have created designated Provider Care Teams.  These Care Teams include your primary Cardiologist (physician) and Advanced Practice Providers (APPs -  Physician Assistants and Nurse Practitioners) who all work together to provide you with the care you need, when you need it.  We recommend signing up for the patient portal called "MyChart".  Sign up information is provided on this After Visit Summary.  MyChart is used to connect with patients for Virtual Visits (Telemedicine).  Patients are able to view lab/test results, encounter notes, upcoming appointments, etc.  Non-urgent messages can be sent to your provider as well.   To learn more about what you can do with MyChart, go to [email or web address].    Your next appointment:   1 year(s)  Provider:   You may see Dina Rich, MD or one of the following Advanced Practice Providers on your designated Care Team:   Randall An, PA-C  Jacolyn Reedy, [place]     Other Instructions

## 2022-11-27 ENCOUNTER — Telehealth: Payer: Self-pay | Admitting: Internal Medicine

## 2022-11-27 MED ORDER — STIOLTO RESPIMAT 2.5-2.5 MCG/ACT IN AERS: 2.0000 | INHALATION_SPRAY | Freq: Every day | RESPIRATORY_TRACT | 1 refills | Status: DC

## 2022-11-27 NOTE — Telephone Encounter (Signed)
Rx sent to pharmacy   

## 2022-11-27 NOTE — Telephone Encounter (Signed)
PT needs a Stialto refill. I have made a FU appt for him. Former Hospital doctor PT not seen in a year. Adv. We may have to do a courtesy refill.    [facility] on [address] In [place].

## 2022-12-01 ENCOUNTER — Ambulatory Visit
Admission: EM | Admit: 2022-12-01 | Discharge: 2022-12-01 | Disposition: A | Discharge status: Home / Self Care | Payer: PPO | Attending: Family Medicine | Admitting: Family Medicine

## 2022-12-01 DIAGNOSIS — L089 Local infection of the skin and subcutaneous tissue, unspecified: Secondary | ICD-10-CM | POA: Diagnosis not present

## 2022-12-01 DIAGNOSIS — L723 Sebaceous cyst: Secondary | ICD-10-CM

## 2022-12-01 MED ORDER — CHLORHEXIDINE GLUCONATE 4 % EX SOLN: Freq: Every day | CUTANEOUS | 0 refills | Status: DC | PRN

## 2022-12-01 MED ORDER — CEPHALEXIN 500 MG PO CAPS: 500.0000 mg | ORAL_CAPSULE | Freq: Two times a day (BID) | ORAL | 0 refills | Status: DC

## 2022-12-01 MED ORDER — MUPIROCIN 2 % EX OINT: 1.0000 | TOPICAL_OINTMENT | Freq: Two times a day (BID) | CUTANEOUS | 0 refills | Status: DC

## 2022-12-01 NOTE — Discharge Instructions (Signed)
Clean the area once or twice a day with Hibiclens, apply the mupirocin ointment and a nonstick dressing.  Take the full course of antibiotics.  Follow-up with dermatology as scheduled for removal

## 2022-12-01 NOTE — ED Provider Notes (Signed)
[facility]    CSN: [id number] Arrival date & time: 12/01/22  0813      History   Chief Complaint Chief Complaint  Patient presents with   Cyst    HPI Leonard Armstrong is a 67 y.o. male.   Presenting today with about a week of progressively worsening pain and a longstanding cyst to the center of his back.  It has been draining overnight every night.  Denies fever, chills, injury to the area, body aches, sweats.  Has been cleaning it with hydrogen peroxide and applying Polysporin.  Has a dermatology appointment in 2 weeks for removal.    Past Medical History:  Diagnosis Date   Allergy    Back pain    Cancer (HCC)    Skin   COPD (chronic obstructive pulmonary disease) (HCC)    Hypertension    Loss of smell    Tachycardia     Patient Active Problem List   Diagnosis Date Noted   Compression fracture of L3 vertebra (HCC) 02/13/2022   Spondylolisthesis at L5-S1 level 02/13/2022   Osteoporosis with current pathological fracture 10/15/2021   Alcohol abuse 08/14/2021   Tobacco abuse 08/14/2021   Preventative health care 08/14/2021   PSVT (paroxysmal supraventricular tachycardia) (HCC) 08/13/2021   Pulmonary emphysema (HCC) 08/07/2021   HTN (hypertension) 06/08/2012    Past Surgical History:  Procedure Laterality Date   BACK SURGERY     HERNIA REPAIR     VASECTOMY         Home Medications    Prior to Admission medications   Medication Sig Start Date End Date Taking? Authorizing Provider  alendronate (FOSAMAX) 70 MG tablet TAKE 1 TABLET(70 MG) BY MOUTH EVERY 7 DAYS WITH A FULL GLASS OF WATER AND ON AN EMPTY STOMACH 10/31/22  Yes Cook, Jayce G, DO  cephALEXin (KEFLEX) 500 MG capsule Take 1 capsule (500 mg total) by mouth 2 (two) times daily. 12/01/22  Yes Particia Nearing, PA-C  cetirizine (ZYRTEC) 10 MG tablet Take 1 tablet (10 mg total) by mouth daily. 11/05/22  Yes Leath-Warren, Sadie Haber, NP  chlorhexidine (HIBICLENS) 4 % external liquid  Apply topically daily as needed. 12/01/22  Yes Particia Nearing, PA-C  chlorthalidone (HYGROTON) 25 MG tablet Take 0.5 tablets (12.5 mg total) by mouth daily. 11/17/22  Yes Cook, Jayce G, DO  diltiazem (CARDIZEM CD) 300 MG 24 hr capsule TAKE 1 CAPSULE(300 MG) BY MOUTH DAILY 11/17/22  Yes Cook, Jayce G, DO  diltiazem (CARDIZEM) 30 MG tablet Take 1 tablet (30 mg total) by mouth every 8 (eight) hours as needed (palpitations). 05/27/22  Yes BranchDorothe Pea, MD  fluticasone (FLONASE) 50 MCG/ACT nasal spray Place 1 spray into both nostrils daily. 03/24/22  Yes Coralyn Helling, MD  losartan (COZAAR) 100 MG tablet Take 1 tablet (100 mg total) by mouth at bedtime. 11/17/22  Yes Cook, Jayce G, DO  mupirocin ointment (BACTROBAN) 2 % Apply 1 Application topically 2 (two) times daily. 12/01/22  Yes Particia Nearing, PA-C  Tiotropium Bromide-Olodaterol (STIOLTO RESPIMAT) 2.5-2.5 MCG/ACT AERS Inhale 2 puffs into the lungs daily. 11/27/22  Yes Nyoka Cowden, MD  acetaminophen (TYLENOL) 500 MG tablet Take 1,000 mg by mouth every 6 (six) hours as needed for mild pain.    [provider]  Azelastine HCl (ASTEPRO) 0.15 % SOLN Place 1 spray into the nose daily in the afternoon. 03/24/22   Coralyn Helling, MD  HYDROcodone-acetaminophen (NORCO) 10-325 MG tablet Take 1 tablet every 4-6  hours by oral route. 07/19/21   [provider]  levalbuterol Pauline Aus HFA) 45 MCG/ACT inhaler Inhale 1-2 puffs into the lungs every 4 (four) hours as needed for wheezing or shortness of breath. 11/05/22   Leath-Warren, Sadie Haber, NP    Family History Family History  Problem Relation Age of Onset   Lung cancer Mother    Cancer Mother        lung   Hypertension Father    Cancer Father        father   Heart disease Other    Cancer Other        colon    Social History Social History   Tobacco Use   Smoking status: Every Day    Current packs/day: 0.50    Average packs/day: 1.9 packs/day for 47.9 years  (92.9 ttl pk-yrs)    Types: Cigarettes    Start date: 01/20/1975   Smokeless tobacco: Former    Types: Chew   Tobacco comments:    Smokes 5 packs of cigarettes a weeks. 09/02/21 Tay  Vaping Use   Vaping status: Never Used  Substance Use Topics   Alcohol use: Yes    Alcohol/week: 42.0 standard drinks of alcohol    Types: 42 Cans of beer per week   Drug use: No    Allergies   Wasp venom, Other, and Prednisone   Review of Systems Review of Systems PER HPI  Physical Exam Triage Vital Signs ED Triage Vitals  Encounter Vitals Group     BP 12/01/22 0855 (!) 161/76     Systolic BP Percentile --      Diastolic BP Percentile --      Pulse Rate 12/01/22 0855 90     Resp 12/01/22 0855 18     Temp 12/01/22 0855 98.8 F (37.1 C)     Temp Source 12/01/22 0855 Oral     SpO2 12/01/22 0855 97 %     Weight --      Height --      Head Circumference --      Peak Flow --      Pain Score 12/01/22 0859 2     Pain Loc --      Pain Education --      Exclude from Growth Chart --    No data found.  Updated Vital Signs BP (!) 161/76 (BP Location: Right Arm)   Pulse 90   Temp 98.8 F (37.1 C) (Oral)   Resp 18   SpO2 97%   Visual Acuity Right Eye Distance:   Left Eye Distance:   Bilateral Distance:    Right Eye Near:   Left Eye Near:    Bilateral Near:     Physical Exam Vitals and nursing note reviewed.  Constitutional:      Appearance: Normal appearance.  HENT:     Head: Atraumatic.  Eyes:     Extraocular Movements: Extraocular movements intact.     Conjunctiva/sclera: Conjunctivae normal.  Cardiovascular:     Rate and Rhythm: Normal rate and regular rhythm.  Pulmonary:     Effort: Pulmonary effort is normal.     Breath sounds: Normal breath sounds.  Musculoskeletal:        General: Normal range of motion.     Cervical back: Normal range of motion and neck supple.  Skin:    General: Skin is warm.     Comments: 2.5 to 3 cm sebaceous cyst present to center of mid  back with  1 cm diameter opening with erythematous edges.  Actively draining significant amount of sebaceous material when pressure applied  Neurological:     General: No focal deficit present.     Mental Status: He is oriented to person, place, and time.  Psychiatric:        Mood and Affect: Mood normal.        Thought Content: Thought content normal.        Judgment: Judgment normal.      UC Treatments / Results  Labs (all labs ordered are listed, but only abnormal results are displayed) Labs Reviewed - No data to display  EKG   Radiology No results found.  Procedures Procedures (including critical care time)  Medications Ordered in UC Medications - No data to display  Initial Impression / Assessment and Plan / UC Course  I have reviewed the triage vital signs and the nursing notes.  Pertinent labs & imaging results that were available during my care of the patient were reviewed by me and considered in my medical decision making (see chart for details).     Infected sebaceous cyst.  Contents expelled as much as possible with direct pressure during exam, will treat with Keflex, mupirocin, Hibiclens and await cyst excision procedure in 2 weeks with dermatology.  Return for worsening symptoms.  Final Clinical Impressions(s) / UC Diagnoses   Final diagnoses:  Infected sebaceous cyst     Discharge Instructions      Clean the area once or twice a day with Hibiclens, apply the mupirocin ointment and a nonstick dressing.  Take the full course of antibiotics.  Follow-up with dermatology as scheduled for removal    ED Prescriptions     Medication Sig Dispense Auth. Provider   cephALEXin (KEFLEX) 500 MG capsule Take 1 capsule (500 mg total) by mouth 2 (two) times daily. 14 capsule Particia Nearing, [place]   mupirocin ointment (BACTROBAN) 2 % Apply 1 Application topically 2 (two) times daily. 40 g Particia Nearing, [place]   chlorhexidine (HIBICLENS) 4 % external  liquid Apply topically daily as needed. 236 mL Particia Nearing, [place]      PDMP not reviewed this encounter.   Roosvelt Maser [place], [place] 12/01/22 [phone]

## 2022-12-01 NOTE — ED Triage Notes (Signed)
Pt states he has a painful cyst on his back that started a week ago. Pt states it is draining at night. Putting hydrogen peroxide, polysporin. Does have a dermatologist appointment in 2 weeks.

## 2022-12-16 DIAGNOSIS — L720 Epidermal cyst: Secondary | ICD-10-CM | POA: Diagnosis not present

## 2022-12-17 ENCOUNTER — Ambulatory Visit (INDEPENDENT_AMBULATORY_CARE_PROVIDER_SITE_OTHER): Payer: PPO | Admitting: Family Medicine

## 2022-12-17 VITALS — BP 134/73 | HR 90 | Temp 98.1°F | Ht 71.0 in | Wt 173.4 lb

## 2022-12-17 DIAGNOSIS — Z1211 Encounter for screening for malignant neoplasm of colon: Secondary | ICD-10-CM | POA: Diagnosis not present

## 2022-12-17 DIAGNOSIS — Z0000 Encounter for general adult medical examination without abnormal findings: Secondary | ICD-10-CM | POA: Insufficient documentation

## 2022-12-17 NOTE — Patient Instructions (Signed)
Cologuard ordered.  Continue your medications.  Follow up in 6 months.

## 2022-12-17 NOTE — Assessment & Plan Note (Signed)
Recommended smoking cessation.  Recommended CT lung cancer screening.  He wants to wait until he discusses with pulmonology. Labs reviewed with the patient. Discussed preventative health care.  Cologuard ordered.

## 2022-12-17 NOTE — Progress Notes (Signed)
Subjective:  Patient ID: Leonard Armstrong, male    DOB: 04-15-1955  Age: 67 y.o. MRN: [id number]  CC: Physical   HPI:  67 old male with the below mentioned medical problems presents for physical  Hypertension is well-controlled.  Lipids well-controlled.  Continues to smoke.  Has previously tried both Wellbutrin and Chantix.  He is currently smoking half a pack a day.  Discussed CT lung cancer screening.  He wants to wait until he discusses with pulmonology.  Advised patient that he can get tetanus and shingles vaccine at the pharmacy.  Discussed colonoscopy.  He wants to proceed with Cologuard.  Patient Active Problem List   Diagnosis Date Noted   Annual physical exam 12/17/2022   Compression fracture of L3 vertebra (HCC) 02/13/2022   Spondylolisthesis at L5-S1 level 02/13/2022   Osteoporosis with current pathological fracture 10/15/2021   Alcohol abuse 08/14/2021   Tobacco abuse 08/14/2021   Preventative health care 08/14/2021   PSVT (paroxysmal supraventricular tachycardia) (HCC) 08/13/2021   Pulmonary emphysema (HCC) 08/07/2021   HTN (hypertension) 06/08/2012    Social Hx   Social History   Socioeconomic History   Marital status: Married    Spouse name: Not on file   Number of children: Not on file   Years of education: Not on file   Highest education level: Not on file  Occupational History   Not on file  Tobacco Use   Smoking status: Every Day    Current packs/day: 0.50    Average packs/day: 1.9 packs/day for 47.9 years (92.9 ttl pk-yrs)    Types: Cigarettes    Start date: 01/20/1975   Smokeless tobacco: Former    Types: Chew   Tobacco comments:    Smokes 5 packs of cigarettes a weeks. 09/02/21 Tay  Vaping Use   Vaping status: Never Used  Substance and Sexual Activity   Alcohol use: Yes    Alcohol/week: 42.0 standard drinks of alcohol    Types: 42 Cans of beer per week   Drug use: No   Sexual activity: Yes    Partners: Female  Other Topics Concern   Not  on file  Social History Narrative   Not on file   Social Determinants of Health   Financial Resource Strain: Not on file  Food Insecurity: Not on file  Transportation Needs: Not on file  Physical Activity: Not on file  Stress: Not on file  Social Connections: Not on file    Review of Systems  Constitutional: Negative.   Respiratory:  Positive for shortness of breath.     Objective:  BP 134/73   Pulse 90   Temp 98.1 F (36.7 C)   Ht 5\' 11"  (1.803 m)   Wt 173 lb 6.4 oz (78.7 kg)   SpO2 97%   BMI 24.18 kg/m      12/17/2022    1:05 PM 12/01/2022    8:55 AM 11/21/2022    9:51 AM  BP/Weight  Systolic BP 134 161 136  Diastolic BP 73 76 78  Wt. (Lbs) 173.4    BMI 24.18 kg/m2      Physical Exam Vitals and nursing note reviewed.  Constitutional:      General: He is not in acute distress.    Appearance: Normal appearance.  HENT:     Head: Normocephalic and atraumatic.  Eyes:     General:        Right eye: No discharge.        Left eye: No  discharge.     Conjunctiva/sclera: Conjunctivae normal.  Cardiovascular:     Rate and Rhythm: Normal rate and regular rhythm.  Pulmonary:     Effort: Pulmonary effort is normal.     Breath sounds: Normal breath sounds. No wheezing, rhonchi or rales.  Neurological:     Mental Status: He is alert.  Psychiatric:        Mood and Affect: Mood normal.        Behavior: Behavior normal.     Lab Results  Component Value Date   WBC 7.0 11/19/2022   HGB 14.1 11/19/2022   HCT 41.6 11/19/2022   PLT 332 11/19/2022   GLUCOSE 95 11/19/2022   CHOL 169 11/19/2022   TRIG 49 11/19/2022   HDL 100 11/19/2022   LDLCALC 59 11/19/2022   ALT 38 11/19/2022   AST 27 11/19/2022   NA 130 (L) 11/19/2022   K 4.2 11/19/2022   CL 91 (L) 11/19/2022   CREATININE 0.67 (L) 11/19/2022   BUN 8 11/19/2022   CO2 25 11/19/2022   TSH 0.769 02/20/2021   HGBA1C 5.1 02/20/2021     Assessment & Plan:   Problem List Items Addressed This Visit        Other   Annual physical exam - Primary    Recommended smoking cessation.  Recommended CT lung cancer screening.  He wants to wait until he discusses with pulmonology. Labs reviewed with the patient. Discussed preventative health care.  Cologuard ordered.      Other Visit Diagnoses     Colon cancer screening       Relevant Orders   Cologuard       Follow-up:  Has follow up in May  Sharman Garrott Adriana Simas DO [facility]

## 2022-12-23 NOTE — Progress Notes (Unsigned)
Leonard Armstrong, male    DOB: 1955-06-05    MRN: [id number]   Brief patient profile:  67  yowm  active smoker/MM   former [facility] Pt self-referred back to pulmonary clinic in [place]  12/24/2022  for GOLD 2 copd    A1AT 09/02/21 >> 91, MM PFT 11/14/21 >> FEV1 2.12 (56%), FEV1% 50, TLC 5.21 (70%), DLCO 48%, +BD   History of Present Illness  12/24/2022  Pulmonary/ 1st office eval/ Leonard Armstrong / [place] [facility]  maint on stiolto  Chief Complaint  Patient presents with   COPD   Cough  Dyspnea:  limited by back / hc parking, does fine at [employer] avg pace  Cough: better now  Sleep: sleeps in recliner due to back or flat bed with lots of pillows x 45 degrees SABA use: not now  02 WUJ:WJXB  LDSCT:rec 12/24/2022   No obvious day to day or daytime pattern/variability or assoc excess/ purulent sputum or mucus plugs or hemoptysis or cp or chest tightness, subjective wheeze or overt sinus or hb symptoms.    Also denies any obvious fluctuation of symptoms with weather or environmental changes or other aggravating or alleviating factors except as outlined above   No unusual exposure hx or h/o childhood pna/ asthma or knowledge of premature birth.  Current Allergies, Complete Past Medical History, Past Surgical History, Family History, and Social History were reviewed in [employer] record.  ROS  The following are not active complaints unless bolded Hoarseness, sore throat, dysphagia, dental problems, itching, sneezing,  nasal congestion or discharge of excess mucus or purulent secretions, ear ache,   fever, chills, sweats, unintended wt loss or wt gain, classically pleuritic or exertional cp,  orthopnea pnd or arm/hand swelling  or leg swelling, presyncope, palpitations, abdominal pain, anorexia, nausea, vomiting, diarrhea  or change in bowel habits or change in bladder habits, change in stools or change in urine, dysuria, hematuria,  rash, arthralgias, visual complaints,  headache, numbness, weakness or ataxia or problems with walking or coordination,  change in mood or  memory.            Outpatient Medications Prior to Visit  Medication Sig Dispense Refill   acetaminophen (TYLENOL) 500 MG tablet Take 1,000 mg by mouth every 6 (six) hours as needed for mild pain.     alendronate (FOSAMAX) 70 MG tablet TAKE 1 TABLET(70 MG) BY MOUTH EVERY 7 DAYS WITH A FULL GLASS OF WATER AND ON AN EMPTY STOMACH 12 tablet 4   Azelastine HCl (ASTEPRO) 0.15 % SOLN Place 1 spray into the nose daily in the afternoon.     cetirizine (ZYRTEC) 10 MG tablet Take 1 tablet (10 mg total) by mouth daily. 30 tablet 0   chlorthalidone (HYGROTON) 25 MG tablet Take 0.5 tablets (12.5 mg total) by mouth daily. 45 tablet 3   diltiazem (CARDIZEM CD) 300 MG 24 hr capsule TAKE 1 CAPSULE(300 MG) BY MOUTH DAILY 90 capsule 2   diltiazem (CARDIZEM) 30 MG tablet Take 1 tablet (30 mg total) by mouth every 8 (eight) hours as needed (palpitations). 10 tablet 0   doxycycline (VIBRAMYCIN) 100 MG capsule Take 100 mg by mouth 2 (two) times daily.     fluticasone (FLONASE) 50 MCG/ACT nasal spray Place 1 spray into both nostrils daily. 16 g 2   HYDROcodone-acetaminophen (NORCO) 10-325 MG tablet Take 1 tablet every 4-6 hours by oral route.     levalbuterol (XOPENEX HFA) 45 MCG/ACT inhaler Inhale 1-2 puffs into the  lungs every 4 (four) hours as needed for wheezing or shortness of breath. 1 each 0   losartan (COZAAR) 100 MG tablet Take 1 tablet (100 mg total) by mouth at bedtime. 90 tablet 2   Tiotropium Bromide-Olodaterol (STIOLTO RESPIMAT) 2.5-2.5 MCG/ACT AERS Inhale 2 puffs into the lungs daily. 4 g 1   mupirocin ointment (BACTROBAN) 2 % Apply 1 Application topically 2 (two) times daily. 40 g 0   No facility-administered medications prior to visit.    Past Medical History:  Diagnosis Date   Allergy    Back pain    Cancer (HCC)    Skin   COPD (chronic obstructive pulmonary disease) (HCC)    Hypertension     Loss of smell    Tachycardia       Objective:     BP 131/77   Pulse 98   Ht 5\' 11"  (1.803 m)   Wt 173 lb (78.5 kg)   SpO2 94%   BMI 24.13 kg/m   SpO2: 94 %  RA   Amb wm walks with cane L hand  HEENT : Oropharynx  clear       NECK :  without  apparent JVD/ palpable Nodes/TM    LUNGS: no acc muscle use,  Mild barrel  contour chest wall with bilateral  Distant bs s audible wheeze and  without cough on insp or exp maneuvers  and mild  Hyperresonant  to  percussion bilaterally     CV:  RRR  no s3 or murmur or increase in P2, and no edema   ABD:  soft and nontender with pos end  insp Hoover's  in the supine position.  No bruits or organomegaly appreciated   MS:  Nl gait/ ext warm without deformities Or obvious joint restrictions  calf tenderness, cyanosis or clubbing     SKIN: warm and dry without lesions    NEURO:  alert, approp, nl sensorium with  no motor or cerebellar deficits apparent.        Assessment   COPD GOLD 2 Active smoker - 12/24/2022  After extensive coaching inhaler device,  effectiveness =    80% with SMI > continue stiolto  and prn saba   Pt is Group B in terms of symptom/risk and laba/lama therefore appropriate rx at this point >>>  stiolto and prn saba   Re SABA :  I spent extra time with pt today reviewing appropriate use of albuterol for prn use on exertion with the following points: 1) saba is for relief of sob that does not improve by walking a slower pace or resting but rather if the pt does not improve after trying this first. 2) If the pt is convinced, as many are, that saba helps recover from activity faster then it's easy to tell if this is the case by re-challenging : ie stop, take the inhaler, then p 5 minutes try the exact same activity (intensity of workload) that just caused the symptoms and see if they are substantially diminished or not after saba 3) if there is an activity that reproducibly causes the symptoms, try the saba 15  min before the activity on alternate days   If in fact the saba really does help, then fine to continue to use it prn but advised may need to look closer at the maintenance regimen being used to achieve better control of airways disease with exertion.      Cigarette smoker Referred to [facility]  12/24/2022   Low-dose CT  lung cancer screening is recommended for patients who are 52-71 years of age with a 20+ pack-year history of smoking and who are currently smoking or quit <=15 years ago. No coughing up blood  No unintentional weight loss of > 15 pounds in the last 6 months - pt is eligible for scanning yearly until age 72 > referred    >>> Counseled re importance of smoking cessation but did not meet time criteria for separate billing     F/ u q 12 m approp unless starts having aecopd on stiolto which would be problematic given his reported severe prednisone intolerance/allergy.         Each maintenance medication was reviewed in detail including emphasizing most importantly the difference between maintenance and prns and under what circumstances the prns are to be triggered using an action plan format where appropriate.  Total time for H and P, chart review, counseling, reviewing hfa/smi device(s) and generating customized AVS unique to this office visit / same day charting  > 40 min new pt eval.           Sandrea Hughs, MD 12/24/2022

## 2022-12-24 ENCOUNTER — Ambulatory Visit: Payer: PPO | Admitting: Internal Medicine

## 2022-12-24 ENCOUNTER — Encounter: Payer: Self-pay | Admitting: Internal Medicine

## 2022-12-24 VITALS — BP 131/77 | HR 98 | Ht 71.0 in | Wt 173.0 lb

## 2022-12-24 DIAGNOSIS — F17210 Nicotine dependence, cigarettes, uncomplicated: Secondary | ICD-10-CM | POA: Diagnosis not present

## 2022-12-24 DIAGNOSIS — J449 Chronic obstructive pulmonary disease, unspecified: Secondary | ICD-10-CM | POA: Diagnosis not present

## 2022-12-24 MED ORDER — STIOLTO RESPIMAT 2.5-2.5 MCG/ACT IN AERS: 2.0000 | INHALATION_SPRAY | Freq: Every day | RESPIRATORY_TRACT | 11 refills | Status: DC

## 2022-12-24 NOTE — Assessment & Plan Note (Signed)
Active smoker - 12/24/2022  After extensive coaching inhaler device,  effectiveness =    80% with SMI > continue stiolto  and prn saba   Pt is Group B in terms of symptom/risk and laba/lama therefore appropriate rx at this point >>>  stiolto and prn saba   Re SABA :  I spent extra time with pt today reviewing appropriate use of albuterol for prn use on exertion with the following points: 1) saba is for relief of sob that does not improve by walking a slower pace or resting but rather if the pt does not improve after trying this first. 2) If the pt is convinced, as many are, that saba helps recover from activity faster then it's easy to tell if this is the case by re-challenging : ie stop, take the inhaler, then p 5 minutes try the exact same activity (intensity of workload) that just caused the symptoms and see if they are substantially diminished or not after saba 3) if there is an activity that reproducibly causes the symptoms, try the saba 15 min before the activity on alternate days   If in fact the saba really does help, then fine to continue to use it prn but advised may need to look closer at the maintenance regimen being used to achieve better control of airways disease with exertion.

## 2022-12-24 NOTE — Assessment & Plan Note (Signed)
Referred to [facility]  12/24/2022   Low-dose CT lung cancer screening is recommended for patients who are 66-65 years of age with a 20+ pack-year history of smoking and who are currently smoking or quit <=15 years ago. No coughing up blood  No unintentional weight loss of > 15 pounds in the last 6 months - pt is eligible for scanning yearly until age 53 > referred    >>> Counseled re importance of smoking cessation but did not meet time criteria for separate billing     F/ u q 12 m approp unless starts having aecopd on stiolto which would be problematic given his reported severe prednisone intolerance/allergy.         Each maintenance medication was reviewed in detail including emphasizing most importantly the difference between maintenance and prns and under what circumstances the prns are to be triggered using an action plan format where appropriate.  Total time for H and P, chart review, counseling, reviewing hfa/smi device(s) and generating customized AVS unique to this office visit / same day charting  > 40 min new pt eval.

## 2022-12-24 NOTE — Patient Instructions (Signed)
No change in your medications   The key is to stop smoking completely before smoking completely stops you!  .My office will be contacting you by phone for referral to [phone]  - if you don't hear back from my office within one week please call us back or notify us thru MyChart and we'll address it right away.   Please schedule a follow up visit in 12  months but call sooner if needed

## 2022-12-25 DIAGNOSIS — Z1211 Encounter for screening for malignant neoplasm of colon: Secondary | ICD-10-CM | POA: Diagnosis not present

## 2022-12-29 ENCOUNTER — Other Ambulatory Visit: Payer: Self-pay

## 2022-12-29 DIAGNOSIS — Z87891 Personal history of nicotine dependence: Secondary | ICD-10-CM

## 2022-12-29 DIAGNOSIS — Z122 Encounter for screening for malignant neoplasm of respiratory organs: Secondary | ICD-10-CM

## 2022-12-29 DIAGNOSIS — F17210 Nicotine dependence, cigarettes, uncomplicated: Secondary | ICD-10-CM

## 2023-01-05 LAB — COLOGUARD: COLOGUARD: NEGATIVE

## 2023-01-11 NOTE — Patient Instructions (Signed)

## 2023-01-11 NOTE — Progress Notes (Signed)
  Virtual Visit via Telephone Note  I connected with Leonard Armstrong , 01/11/23 7:55 PM by a telemedicine application and verified that I am speaking with the correct person using two identifiers.  Location: Patient: home Provider: home   I discussed the limitations of evaluation and management by telemedicine and the availability of in person appointments. The patient expressed understanding and agreed to proceed.   Shared Decision Making Visit Lung Cancer Screening Program [phone])   Eligibility: 68 y.o. Pack Years Smoking History Calculation 74 pack years  (# packs/per year x # years smoked) Recent History of coughing up blood  no Unexplained weight loss? no ( >Than 15 pounds within the last 6 months ) Prior History Lung / other cancer no (Diagnosis within the last 5 years already requiring surveillance chest CT Scans). Smoking Status Current Smoker   Visit Components: Discussion included one or more decision making aids. YES Discussion included risk/benefits of screening. YES Discussion included potential follow up diagnostic testing for abnormal scans. YES Discussion included meaning and risk of over diagnosis. YES Discussion included meaning and risk of False Positives. YES Discussion included meaning of total radiation exposure. YES  Counseling Included: Importance of adherence to annual lung cancer LDCT screening. YES Impact of comorbidities on ability to participate in the program. YES Ability and willingness to under diagnostic treatment. YES  Smoking Cessation Counseling: Current Smokers:  Discussed importance of smoking cessation. yes Information about tobacco cessation classes and interventions provided to patient. yes Patient provided with ticket for LDCT Scan. yes Symptomatic Patient. NO Diagnosis Code: Tobacco Use Z72.0 Asymptomatic Patient yes  Counseling (Intermediate counseling: > three minutes counseling) H9563   Z12.2-Screening of respiratory  organs Z87.891-Personal history of nicotine  dependence   [name] 01/11/23

## 2023-01-12 ENCOUNTER — Ambulatory Visit: Payer: PPO | Admitting: Adult Health

## 2023-01-12 ENCOUNTER — Encounter: Payer: Self-pay | Admitting: Adult Health

## 2023-01-12 DIAGNOSIS — F17210 Nicotine dependence, cigarettes, uncomplicated: Secondary | ICD-10-CM

## 2023-01-14 ENCOUNTER — Ambulatory Visit (HOSPITAL_COMMUNITY)
Admission: RE | Admit: 2023-01-14 | Discharge: 2023-01-14 | Disposition: A | Discharge status: Home / Self Care | Payer: PPO | Source: Ambulatory Visit | Attending: Family Medicine | Admitting: Family Medicine

## 2023-01-14 DIAGNOSIS — F17210 Nicotine dependence, cigarettes, uncomplicated: Secondary | ICD-10-CM | POA: Insufficient documentation

## 2023-01-14 DIAGNOSIS — Z122 Encounter for screening for malignant neoplasm of respiratory organs: Secondary | ICD-10-CM | POA: Diagnosis not present

## 2023-01-14 DIAGNOSIS — Z87891 Personal history of nicotine dependence: Secondary | ICD-10-CM | POA: Diagnosis not present

## 2023-01-15 ENCOUNTER — Encounter: Payer: PPO | Admitting: Physician Assistant

## 2023-01-21 ENCOUNTER — Other Ambulatory Visit: Payer: Self-pay | Admitting: Cardiology

## 2023-01-23 ENCOUNTER — Telehealth: Payer: Self-pay

## 2023-01-23 ENCOUNTER — Other Ambulatory Visit: Payer: Self-pay

## 2023-01-23 DIAGNOSIS — R911 Solitary pulmonary nodule: Secondary | ICD-10-CM

## 2023-01-23 DIAGNOSIS — F17210 Nicotine dependence, cigarettes, uncomplicated: Secondary | ICD-10-CM

## 2023-01-23 DIAGNOSIS — Z122 Encounter for screening for malignant neoplasm of respiratory organs: Secondary | ICD-10-CM

## 2023-01-23 DIAGNOSIS — Z87891 Personal history of nicotine dependence: Secondary | ICD-10-CM

## 2023-01-23 NOTE — Telephone Encounter (Signed)
Called and spoke with patient. Advised radiology recommends a 6 month follow up due to no previous scan and multiple nodules seen. Largest at 7.3 mm. Patient to discuss statin therapy with PCP. Plan and results sent to PCP. 6 month CT order placed. Patient has no questions.

## 2023-02-05 ENCOUNTER — Encounter: Payer: Self-pay | Admitting: Nurse Practitioner

## 2023-02-05 ENCOUNTER — Ambulatory Visit (INDEPENDENT_AMBULATORY_CARE_PROVIDER_SITE_OTHER): Payer: PPO | Admitting: Nurse Practitioner

## 2023-02-05 VITALS — BP 106/67 | HR 88 | Temp 98.4°F | Ht 71.0 in | Wt 177.2 lb

## 2023-02-05 DIAGNOSIS — Z720 Tobacco use: Secondary | ICD-10-CM

## 2023-02-05 DIAGNOSIS — Z0000 Encounter for general adult medical examination without abnormal findings: Secondary | ICD-10-CM | POA: Diagnosis not present

## 2023-02-05 NOTE — Progress Notes (Signed)
Subjective:    Leonard Armstrong is a 68 y.o. male who presents for a Welcome to Medicare exam.         Objective:    Today's Vitals   02/05/23 0843  BP: 106/67  Pulse: 88  Temp: 98.4 F (36.9 C)  SpO2: 96%  Weight: 177 lb 3.2 oz (80.4 kg)  Height: 5\' 11"  (1.803 m)   Body mass index is 24.71 kg/m.  Medications Outpatient Encounter Medications as of 02/05/2023  Medication Sig   acetaminophen (TYLENOL) 500 MG tablet Take 1,000 mg by mouth every 6 (six) hours as needed for mild pain.   alendronate (FOSAMAX) 70 MG tablet TAKE 1 TABLET(70 MG) BY MOUTH EVERY 7 DAYS WITH A FULL GLASS OF WATER AND ON AN EMPTY STOMACH   Azelastine HCl (ASTEPRO) 0.15 % SOLN Place 1 spray into the nose daily in the afternoon.   chlorthalidone (HYGROTON) 25 MG tablet Take 0.5 tablets (12.5 mg total) by mouth daily.   diltiazem (CARDIZEM CD) 300 MG 24 hr capsule TAKE 1 CAPSULE BY MOUTH DAILY.   diltiazem (CARDIZEM) 30 MG tablet Take 1 tablet (30 mg total) by mouth every 8 (eight) hours as needed (palpitations).   fluticasone (FLONASE) 50 MCG/ACT nasal spray Place 1 spray into both nostrils daily.   HYDROcodone-acetaminophen (NORCO) 10-325 MG tablet Take 1 tablet every 4-6 hours by oral route.   levalbuterol (XOPENEX HFA) 45 MCG/ACT inhaler Inhale 1-2 puffs into the lungs every 4 (four) hours as needed for wheezing or shortness of breath.   losartan (COZAAR) 100 MG tablet Take 1 tablet (100 mg total) by mouth at bedtime.   Tiotropium Bromide-Olodaterol (STIOLTO RESPIMAT) 2.5-2.5 MCG/ACT AERS Inhale 2 puffs into the lungs daily.   [DISCONTINUED] cetirizine (ZYRTEC) 10 MG tablet Take 1 tablet (10 mg total) by mouth daily. (Patient not taking: Reported on 02/05/2023)   [DISCONTINUED] doxycycline (VIBRAMYCIN) 100 MG capsule Take 100 mg by mouth 2 (two) times daily. (Patient not taking: Reported on 02/05/2023)   No facility-administered encounter medications on file as of 02/05/2023.     History: Past Medical  History:  Diagnosis Date   Allergy    Back pain    Cancer (HCC)    Skin   COPD (chronic obstructive pulmonary disease) (HCC)    Hypertension    Loss of smell    Tachycardia    Past Surgical History:  Procedure Laterality Date   BACK SURGERY     HERNIA REPAIR     VASECTOMY      Family History  Problem Relation Age of Onset   Lung cancer Mother    Cancer Mother        lung   Hypertension Father    Cancer Father        father   Heart disease Other    Cancer Other        colon   Social History   Occupational History   Not on file  Tobacco Use   Smoking status: Every Day    Current packs/day: 0.50    Average packs/day: 1.9 packs/day for 48.0 years (93.0 ttl pk-yrs)    Types: Cigarettes    Start date: 01/20/1975   Smokeless tobacco: Former    Types: Chew   Tobacco comments:    Smokes 5 packs of cigarettes a weeks. 09/02/21 Tay  Vaping Use   Vaping status: Never Used  Substance and Sexual Activity   Alcohol use: Yes    Alcohol/week: 42.0 standard drinks  of alcohol    Types: 42 Cans of beer per week   Drug use: No   Sexual activity: Yes    Partners: Female    Tobacco Counseling Ready to quit: No Counseling given: Yes Tobacco comments: Smokes 1/2 ppd   Immunizations and Health Maintenance Immunization History  Administered Date(s) Administered   Fluad Trivalent(High Dose 65+) 11/17/2022   Influenza Inj Mdck Quad Pf 10/26/2018   Influenza Nasal 12/01/2017   Influenza,inj,Quad PF,6+ Mos 11/04/2016, 12/01/2017   Influenza-Unspecified 12/03/2012, 09/29/2013, 11/15/2015, 10/27/2018, 11/21/2019, 10/09/2021   Moderna Covid-19 Fall Seasonal Vaccine 18yrs & older 11/11/2021, 11/25/2022   Moderna Sars-Covid-2 Vaccination 03/31/2019, 05/03/2019   PNEUMOCOCCAL CONJUGATE-20 08/06/2021   Pneumococcal Polysaccharide-23 04/11/2013, 10/23/2021   Respiratory Syncytial Virus Vaccine,Recomb Aduvanted(Arexvy) 10/23/2021   Td 01/11/2004   Td (Adult), 2 Lf Tetanus Toxid,  Preservative Free 01/11/2004   Last bone density done around a year ago through another office.   Activities of Daily Living Activity limited due to chronic back issues; has finished PT and continues exercises  Physical Exam     Advanced Directives: Packet on ACP given during visit and discussed.     Lungs clear. Heart RRR. Abdomen soft non distended non tender.       Assessment:     Goals   Continue to reduce cigarette smoking using OTC products. Has decreased to 1/2 ppd. Has tried multiple prescription medications without success.       Depression Screen    11/17/2022    8:41 AM 05/15/2022    8:53 AM 02/13/2022    8:45 AM 08/13/2021    1:53 PM  PHQ 2/9 Scores  PHQ - 2 Score 1 0 0 0  PHQ- 9 Score 3  2      Fall Risk    11/17/2022    8:41 AM  Fall Risk   Falls in the past year? 0    Cognitive Function    Mini-Cog - 02/05/23 0859     Normal clock drawing test? yes    How many words correct? 3                  Patient Care Team: Tommie Sams, DO as PCP - General (Family Medicine) Wyline Mood Dorothe Pea, MD as PCP - Cardiology (Cardiology) Nyoka Cowden, MD as Consulting Physician (Pulmonary Disease)     Plan:   Medicare annual wellness visit, initial  Tobacco abuse  Has had Cologuard testing, Lung cancer screening, COVID booster and flu vaccine. Discussed smoking cessation. Defers medication at this time.    I have personally reviewed and noted the following in the patient's chart:   Medical and social history Use of alcohol, tobacco or illicit drugs  Current medications and supplements Functional ability and status Nutritional status Physical activity Advanced directives List of other physicians Hospitalizations, surgeries, and ER visits in previous 12 months Vitals Screenings to include cognitive, depression, and falls Referrals and appointments  In addition, I have reviewed and discussed with patient certain preventive protocols,  quality metrics, and best practice recommendations. A written personalized care plan for preventive services as well as general preventive health recommendations were provided to patient.     Campbell Riches, NP 02/05/2023

## 2023-04-17 ENCOUNTER — Other Ambulatory Visit: Payer: Self-pay | Admitting: Cardiology

## 2023-05-11 ENCOUNTER — Ambulatory Visit
Admission: EM | Admit: 2023-05-11 | Discharge: 2023-05-11 | Disposition: A | Discharge status: Home / Self Care | Attending: Nurse Practitioner | Admitting: Nurse Practitioner

## 2023-05-11 DIAGNOSIS — L03116 Cellulitis of left lower limb: Secondary | ICD-10-CM

## 2023-05-11 MED ORDER — DOXYCYCLINE HYCLATE 100 MG PO TABS: 100.0000 mg | ORAL_TABLET | Freq: Two times a day (BID) | ORAL | 0 refills | Status: AC

## 2023-05-11 NOTE — ED Triage Notes (Addendum)
 Pt states he has a insect bite possible infected tick or spider bite on his left upper thigh that he noticed Saturday.

## 2023-05-11 NOTE — Discharge Instructions (Addendum)
 Take medication as prescribed. May take over-the-counter Tylenol  as needed for pain, fever, or general discomfort. Cleanse the area twice daily with an antibacterial soap such as Dial gold bar soap. May apply over-the-counter Neosporin or bacitracin to the affected area twice daily while symptoms persist. Apply cool compresses to the area as needed for pain or swelling. Monitor the area for worsening.  Follow-up immediately if you experience increased redness, develop foul-smelling drainage from the site, or if you develop fever, chills, or other concerns. Follow-up as needed.

## 2023-05-11 NOTE — ED Provider Notes (Signed)
 [facility]    CSN: [id number] Arrival date & time: 05/11/23  0801      History   Chief Complaint Chief Complaint  Patient presents with   Insect Bite    HPI Leonard Armstrong is a 68 y.o. male.   The history is provided by the patient.   Patient presents for complaints of a possible insect bite to the left upper leg.  Patient states that he noticed the area 2 days ago.  He states that the area is "itchy", denies pain to the area.  Patient states that he is unsure if he was bitten by a tick or a spider.  Patient denies fever, chills, drainage from the site, increased redness or swelling, chest pain, abdominal pain, nausea, vomiting, diarrhea, or rash.  Patient states wife cleansed the area when she noticed it with alcohol.  Past Medical History:  Diagnosis Date   Allergy    Back pain    Cancer (HCC)    Skin   COPD (chronic obstructive pulmonary disease) (HCC)    Hypertension    Loss of smell    Tachycardia     Patient Active Problem List   Diagnosis Date Noted   Cigarette smoker 12/24/2022   Annual physical exam 12/17/2022   Compression fracture of L3 vertebra (HCC) 02/13/2022   Spondylolisthesis at L5-S1 level 02/13/2022   Osteoporosis with current pathological fracture 10/15/2021   Alcohol abuse 08/14/2021   Tobacco abuse 08/14/2021   Preventative health care 08/14/2021   PSVT (paroxysmal supraventricular tachycardia) (HCC) 08/13/2021   COPD GOLD 2 08/07/2021   HTN (hypertension) 06/08/2012    Past Surgical History:  Procedure Laterality Date   BACK SURGERY     HERNIA REPAIR     VASECTOMY         Home Medications    Prior to Admission medications   Medication Sig Start Date End Date Taking? Authorizing Provider  alendronate  (FOSAMAX ) 70 MG tablet TAKE 1 TABLET(70 MG) BY MOUTH EVERY 7 DAYS WITH A FULL GLASS OF WATER AND ON AN EMPTY STOMACH 10/31/22  Yes Cook, Jayce G, DO  Azelastine  HCl (ASTEPRO ) 0.15 % SOLN Place 1 spray into the nose daily in  the afternoon. 03/24/22  Yes Sood, Vineet, MD  chlorthalidone  (HYGROTON ) 25 MG tablet Take 0.5 tablets (12.5 mg total) by mouth daily. 11/17/22  Yes Cook, Jayce G, DO  diltiazem  (CARDIZEM  CD) 300 MG 24 hr capsule TAKE 1 CAPSULE BY MOUTH DAILY. 01/21/23  Yes Branch, Joyceann No, MD  doxycycline  (VIBRA -TABS) 100 MG tablet Take 1 tablet (100 mg total) by mouth 2 (two) times daily for 5 days. 05/11/23 05/16/23 Yes Leath-Warren, Belen Bowers, NP  levalbuterol  (XOPENEX  HFA) 45 MCG/ACT inhaler Inhale 1-2 puffs into the lungs every 4 (four) hours as needed for wheezing or shortness of breath. 11/05/22  Yes Leath-Warren, Belen Bowers, NP  losartan  (COZAAR ) 100 MG tablet TAKE 1 TABLET(100 MG) BY MOUTH AT BEDTIME 04/17/23  Yes Branch, Joyceann No, MD  Tiotropium Bromide-Olodaterol (STIOLTO RESPIMAT ) 2.5-2.5 MCG/ACT AERS Inhale 2 puffs into the lungs daily. 12/24/22  Yes Diamond Formica, MD  acetaminophen  (TYLENOL ) 500 MG tablet Take 1,000 mg by mouth every 6 (six) hours as needed for mild pain.    [provider]  diltiazem  (CARDIZEM ) 30 MG tablet Take 1 tablet (30 mg total) by mouth every 8 (eight) hours as needed (palpitations). 05/27/22   Laurann Pollock, MD  fluticasone  (FLONASE ) 50 MCG/ACT nasal spray Place 1 spray into both  nostrils daily. 03/24/22   Sood, Vineet, MD  HYDROcodone-acetaminophen  (NORCO) 10-325 MG tablet Take 1 tablet every 4-6 hours by oral route. 07/19/21   [provider]    Family History Family History  Problem Relation Age of Onset   Lung cancer Mother    Cancer Mother        lung   Hypertension Father    Cancer Father        father   Heart disease Other    Cancer Other        colon    Social History Social History   Tobacco Use   Smoking status: Every Day    Current packs/day: 0.50    Average packs/day: 1.9 packs/day for 48.3 years (93.1 ttl pk-yrs)    Types: Cigarettes    Start date: 01/20/1975   Smokeless tobacco: Former    Types: Chew   Tobacco comments:     Smokes 5 packs of cigarettes a weeks. 09/02/21 Tay  Vaping Use   Vaping status: Never Used  Substance Use Topics   Alcohol use: Yes    Alcohol/week: 42.0 standard drinks of alcohol    Types: 42 Cans of beer per week   Drug use: No     Allergies   Wasp venom, Corticosteroids, Other, and Prednisone   Review of Systems Review of Systems Per HPI  Physical Exam Triage Vital Signs ED Triage Vitals  Encounter Vitals Group     BP 05/11/23 0820 (!) 163/79     Systolic BP Percentile --      Diastolic BP Percentile --      Pulse Rate 05/11/23 0820 81     Resp 05/11/23 0820 18     Temp 05/11/23 0820 98.5 F (36.9 C)     Temp Source 05/11/23 0820 Oral     SpO2 05/11/23 0820 93 %     Weight --      Height --      Head Circumference --      Peak Flow --      Pain Score 05/11/23 0823 0     Pain Loc --      Pain Education --      Exclude from Growth Chart --    No data found.  Updated Vital Signs BP (!) 163/79 (BP Location: Right Arm)   Pulse 81   Temp 98.5 F (36.9 C) (Oral)   Resp 18   SpO2 93%   Visual Acuity Right Eye Distance:   Left Eye Distance:   Bilateral Distance:    Right Eye Near:   Left Eye Near:    Bilateral Near:     Physical Exam Vitals and nursing note reviewed.  Constitutional:      General: He is not in acute distress.    Appearance: Normal appearance.  HENT:     Head: Normocephalic.  Eyes:     Extraocular Movements: Extraocular movements intact.     Pupils: Pupils are equal, round, and reactive to light.  Cardiovascular:     Rate and Rhythm: Normal rate and regular rhythm.     Pulses: Normal pulses.     Heart sounds: Normal heart sounds.  Pulmonary:     Effort: Pulmonary effort is normal. No respiratory distress.     Breath sounds: Normal breath sounds. No stridor. No wheezing, rhonchi or rales.  Abdominal:     General: Bowel sounds are normal.     Palpations: Abdomen is soft.  Musculoskeletal:  Cervical back: Normal range of  motion.  Skin:    General: Skin is warm and dry.       Neurological:     General: No focal deficit present.     Mental Status: He is alert and oriented to person, place, and time.  Psychiatric:        Mood and Affect: Mood normal.        Behavior: Behavior normal.      UC Treatments / Results  Labs (all labs ordered are listed, but only abnormal results are displayed) Labs Reviewed - No data to display  EKG   Radiology No results found.  Procedures Procedures (including critical care time)  Medications Ordered in UC Medications - No data to display  Initial Impression / Assessment and Plan / UC Course  I have reviewed the triage vital signs and the nursing notes.  Pertinent labs & imaging results that were available during my care of the patient were reviewed by me and considered in my medical decision making (see chart for details).  Will treat for cellulitis of the left upper thigh/buttock for the next 5 days with doxycycline  100 mg twice daily.  Supportive care recommendations were provided and discussed with the patient to include over-the-counter analgesics, cleansing the area with an antibacterial soap, and using over-the-counter antibiotic ointment.  Discussed indications with patient regarding follow-up.  Patient was in agreement with this plan of care and verbalizes understanding.  All questions were answered.  Patient stable for discharge.  Final Clinical Impressions(s) / UC Diagnoses   Final diagnoses:  None     Discharge Instructions      Take medication as prescribed. May take over-the-counter Tylenol  as needed for pain, fever, or general discomfort. Cleanse the area twice daily with an antibacterial soap such as Dial gold bar soap. May apply over-the-counter Neosporin or bacitracin to the affected area twice daily while symptoms persist. Apply cool compresses to the area as needed for pain or swelling. Monitor the area for worsening.  Follow-up  immediately if you experience increased redness, develop foul-smelling drainage from the site, or if you develop fever, chills, or other concerns. Follow-up as needed.     ED Prescriptions     Medication Sig Dispense Auth. Provider   doxycycline  (VIBRA -TABS) 100 MG tablet Take 1 tablet (100 mg total) by mouth 2 (two) times daily for 5 days. 10 tablet Leath-Warren, Belen Bowers, NP      PDMP not reviewed this encounter.   Hardy Lia, NP 05/11/23 [phone]

## 2023-05-19 ENCOUNTER — Telehealth: Payer: PPO | Admitting: Family Medicine

## 2023-05-19 ENCOUNTER — Encounter (INDEPENDENT_AMBULATORY_CARE_PROVIDER_SITE_OTHER): Payer: Self-pay | Admitting: *Deleted

## 2023-05-27 ENCOUNTER — Ambulatory Visit (INDEPENDENT_AMBULATORY_CARE_PROVIDER_SITE_OTHER): Admitting: Family Medicine

## 2023-05-27 ENCOUNTER — Encounter: Payer: Self-pay | Admitting: Family Medicine

## 2023-05-27 VITALS — BP 127/78 | HR 105 | Temp 97.7°F | Ht 71.0 in | Wt 174.0 lb

## 2023-05-27 DIAGNOSIS — J449 Chronic obstructive pulmonary disease, unspecified: Secondary | ICD-10-CM | POA: Diagnosis not present

## 2023-05-27 DIAGNOSIS — I10 Essential (primary) hypertension: Secondary | ICD-10-CM

## 2023-05-27 DIAGNOSIS — M8000XD Age-related osteoporosis with current pathological fracture, unspecified site, subsequent encounter for fracture with routine healing: Secondary | ICD-10-CM | POA: Diagnosis not present

## 2023-05-27 DIAGNOSIS — M62838 Other muscle spasm: Secondary | ICD-10-CM | POA: Insufficient documentation

## 2023-05-27 MED ORDER — BACLOFEN 10 MG PO TABS: 5.0000 mg | ORAL_TABLET | Freq: Three times a day (TID) | ORAL | 0 refills | Status: DC | PRN

## 2023-05-27 NOTE — Assessment & Plan Note (Signed)
 Treating with baclofen.  Advised heat.

## 2023-05-27 NOTE — Patient Instructions (Signed)
 Heat. Medication as prescribed.  Continue your medications.  Labs and Dexa at follow up.   See you in 6 months.

## 2023-05-27 NOTE — Progress Notes (Signed)
 Subjective:  Patient ID: Leonard Armstrong, male    DOB: [birth year]  Age: 68 y.o. MRN: [id number]  CC:   Chief Complaint  Patient presents with   Shoulder Pain    Left shoulder pain     HPI:  68 year old male presents for follow-up.  Hypertension stable on diltiazem , losartan , and chlorthalidone .  Patient continues to smoke.  Advised him to let me know if there is anything I can do to help him quit smoking.  COPD is stable on Stiolto.  Patient remains on Fosamax  for osteoporosis.  His last DEXA scan was in 2023.  Planning for DEXA scan next follow-up visit.  Patient reports that he has recently been experiencing pain of the left shoulder.  Localizes the pain to the left trapezius.  Better with heat.  Worse at night.  Worse with certain activities.  No recent fall, trauma, injury.  Patient Active Problem List   Diagnosis Date Noted   Trapezius muscle spasm 05/27/2023   Cigarette smoker 12/24/2022   Compression fracture of L3 vertebra (HCC) 02/13/2022   Spondylolisthesis at L5-S1 level 02/13/2022   Osteoporosis with current pathological fracture 10/15/2021   Alcohol abuse 08/14/2021   Preventative health care 08/14/2021   PSVT (paroxysmal supraventricular tachycardia) (HCC) 08/13/2021   COPD GOLD 2 08/07/2021   HTN (hypertension) 06/08/2012    Social Hx   Social History   Socioeconomic History   Marital status: Married    Spouse name: Not on file   Number of children: Not on file   Years of education: Not on file   Highest education level: Not on file  Occupational History   Not on file  Tobacco Use   Smoking status: Every Day    Current packs/day: 0.50    Average packs/day: 1.9 packs/day for 48.3 years (93.1 ttl pk-yrs)    Types: Cigarettes    Start date: 01/20/1975   Smokeless tobacco: Former    Types: Chew   Tobacco comments:    Smokes 5 packs of cigarettes a weeks. 09/02/21 Tay  Vaping Use   Vaping status: Never Used  Substance and Sexual Activity    Alcohol use: Yes    Alcohol/week: 42.0 standard drinks of alcohol    Types: 42 Cans of beer per week   Drug use: No   Sexual activity: Yes    Partners: Female  Other Topics Concern   Not on file  Social History Narrative   Not on file   Social Drivers of Health   Financial Resource Strain: Not on file  Food Insecurity: Not on file  Transportation Needs: Not on file  Physical Activity: Not on file  Stress: Not on file  Social Connections: Not on file    Review of Systems Per HPI  Objective:  BP 127/78   Pulse (!) 105   Temp 97.7 F (36.5 C)   Ht 5\' 11"  (1.803 m)   Wt 174 lb (78.9 kg)   SpO2 98%   BMI 24.27 kg/m      05/27/2023    9:53 AM 05/27/2023    9:17 AM 05/11/2023    8:20 AM  BP/Weight  Systolic BP 127 162 163  Diastolic BP 78 82 79  Wt. (Lbs)  174   BMI  24.27 kg/m2     Physical Exam Vitals and nursing note reviewed.  Constitutional:      General: He is not in acute distress.    Appearance: Normal appearance.  HENT:     Head:  Normocephalic and atraumatic.  Cardiovascular:     Rate and Rhythm: Normal rate and regular rhythm.  Pulmonary:     Effort: Pulmonary effort is normal.     Breath sounds: Normal breath sounds. No wheezing, rhonchi or rales.  Musculoskeletal:     Comments: Tenderness over the left trapezius.  Neurological:     Mental Status: He is alert.  Psychiatric:        Mood and Affect: Mood normal.        Behavior: Behavior normal.     Lab Results  Component Value Date   WBC 7.0 11/19/2022   HGB 14.1 11/19/2022   HCT 41.6 11/19/2022   PLT 332 11/19/2022   GLUCOSE 95 11/19/2022   CHOL 169 11/19/2022   TRIG 49 11/19/2022   HDL 100 11/19/2022   LDLCALC 59 11/19/2022   ALT 38 11/19/2022   AST 27 11/19/2022   NA 130 (L) 11/19/2022   K 4.2 11/19/2022   CL 91 (L) 11/19/2022   CREATININE 0.67 (L) 11/19/2022   BUN 8 11/19/2022   CO2 25 11/19/2022   TSH 0.769 02/20/2021   HGBA1C 5.1 02/20/2021     Assessment & Plan:   Primary hypertension Assessment & Plan: At goal.  Continue current medications.   Osteoporosis with current pathological fracture with routine healing, unspecified osteoporosis type, subsequent encounter Assessment & Plan: DEXA scan at follow-up.   COPD GOLD 2 Assessment & Plan: Stable. Continue Stiolto.   Trapezius muscle spasm Assessment & Plan: Treating with baclofen.  Advised heat.  Orders: -     Baclofen; Take 0.5-1 tablets (5-10 mg total) by mouth 3 (three) times daily as needed for muscle spasms.  Dispense: 30 each; Refill: 0    Follow-up: 6 months  Talin Rozeboom Debrah Fan DO [facility]

## 2023-05-27 NOTE — Assessment & Plan Note (Signed)
 At goal. Continue current medications.

## 2023-05-27 NOTE — Assessment & Plan Note (Signed)
 Stable. Continue Stiolto.

## 2023-05-27 NOTE — Assessment & Plan Note (Signed)
 DEXA scan at follow-up.

## 2023-07-28 ENCOUNTER — Ambulatory Visit (HOSPITAL_COMMUNITY)
Admission: RE | Admit: 2023-07-28 | Discharge: 2023-07-28 | Disposition: A | Discharge status: Home / Self Care | Source: Ambulatory Visit | Attending: Acute Care | Admitting: Acute Care

## 2023-07-28 DIAGNOSIS — R911 Solitary pulmonary nodule: Secondary | ICD-10-CM | POA: Diagnosis not present

## 2023-07-28 DIAGNOSIS — J439 Emphysema, unspecified: Secondary | ICD-10-CM | POA: Diagnosis not present

## 2023-07-28 DIAGNOSIS — F17210 Nicotine dependence, cigarettes, uncomplicated: Secondary | ICD-10-CM | POA: Insufficient documentation

## 2023-07-28 DIAGNOSIS — Z87891 Personal history of nicotine dependence: Secondary | ICD-10-CM | POA: Diagnosis not present

## 2023-07-28 DIAGNOSIS — I700 Atherosclerosis of aorta: Secondary | ICD-10-CM | POA: Diagnosis not present

## 2023-07-28 DIAGNOSIS — Z122 Encounter for screening for malignant neoplasm of respiratory organs: Secondary | ICD-10-CM | POA: Diagnosis not present

## 2023-08-04 ENCOUNTER — Other Ambulatory Visit: Payer: Self-pay

## 2023-08-04 DIAGNOSIS — Z87891 Personal history of nicotine dependence: Secondary | ICD-10-CM

## 2023-08-04 DIAGNOSIS — Z122 Encounter for screening for malignant neoplasm of respiratory organs: Secondary | ICD-10-CM

## 2023-08-04 DIAGNOSIS — F17210 Nicotine dependence, cigarettes, uncomplicated: Secondary | ICD-10-CM

## 2023-08-16 ENCOUNTER — Other Ambulatory Visit: Payer: Self-pay | Admitting: Cardiology

## 2023-11-05 ENCOUNTER — Other Ambulatory Visit: Payer: Self-pay | Admitting: Family Medicine

## 2023-11-06 ENCOUNTER — Other Ambulatory Visit: Payer: Self-pay | Admitting: Internal Medicine

## 2023-11-27 ENCOUNTER — Encounter: Payer: Self-pay | Admitting: Family Medicine

## 2023-11-27 ENCOUNTER — Ambulatory Visit (INDEPENDENT_AMBULATORY_CARE_PROVIDER_SITE_OTHER): Admitting: Family Medicine

## 2023-11-27 VITALS — BP 122/72 | HR 66 | Temp 97.2°F | Ht 71.0 in | Wt 172.0 lb

## 2023-11-27 DIAGNOSIS — F17210 Nicotine dependence, cigarettes, uncomplicated: Secondary | ICD-10-CM

## 2023-11-27 DIAGNOSIS — I10 Essential (primary) hypertension: Secondary | ICD-10-CM | POA: Diagnosis not present

## 2023-11-27 DIAGNOSIS — J449 Chronic obstructive pulmonary disease, unspecified: Secondary | ICD-10-CM | POA: Diagnosis not present

## 2023-11-27 DIAGNOSIS — M8000XD Age-related osteoporosis with current pathological fracture, unspecified site, subsequent encounter for fracture with routine healing: Secondary | ICD-10-CM

## 2023-11-27 DIAGNOSIS — Z125 Encounter for screening for malignant neoplasm of prostate: Secondary | ICD-10-CM

## 2023-11-27 DIAGNOSIS — Z13220 Encounter for screening for lipoid disorders: Secondary | ICD-10-CM

## 2023-11-27 DIAGNOSIS — Z130 Encounter for screening for diseases of the blood and blood-forming organs and certain disorders involving the immune mechanism: Secondary | ICD-10-CM

## 2023-11-27 NOTE — Patient Instructions (Signed)
Labs ordered.  Follow up in 6 months.  Take care  Dr. Lareina Espino  

## 2023-11-30 MED ORDER — LOSARTAN POTASSIUM 100 MG PO TABS: 100.0000 mg | ORAL_TABLET | Freq: Every day | ORAL | 3 refills | Status: DC

## 2023-11-30 NOTE — Progress Notes (Signed)
 Subjective:  Patient ID: Leonard Armstrong, male    DOB: 1955/05/05  Age: 68 y.o. MRN: [id number]  CC:   Chief Complaint  Patient presents with   6 month follow up     HPI:  68 year old male presents for follow-up.  Has underlying COPD.  Continues to smoke.  Follows with pulmonology.  He is not interested in cessation at this time.  Blood pressure is well-controlled on diltiazem , losartan , and chlorthalidone .  Patient needs a bone density.  He is on alendronate  for osteoporosis.  Patient needs labs as well.  Immunizations are up-to-date excluding shingles vaccine.  He can get this at his pharmacy.  No chest pain.  Shortness of breath at baseline.  Patient Active Problem List   Diagnosis Date Noted   Trapezius muscle spasm 05/27/2023   Cigarette smoker 12/24/2022   Compression fracture of L3 vertebra (HCC) 02/13/2022   Spondylolisthesis at L5-S1 level 02/13/2022   Osteoporosis with current pathological fracture 10/15/2021   Alcohol abuse 08/14/2021   Preventative health care 08/14/2021   PSVT (paroxysmal supraventricular tachycardia) 08/13/2021   COPD GOLD 2 08/07/2021   HTN (hypertension) 06/08/2012    Social Hx   Social History   Socioeconomic History   Marital status: Married    Spouse name: Not on file   Number of children: Not on file   Years of education: Not on file   Highest education level: Not on file  Occupational History   Not on file  Tobacco Use   Smoking status: Every Day    Current packs/day: 0.50    Average packs/day: 1.9 packs/day for 48.9 years (93.4 ttl pk-yrs)    Types: Cigarettes    Start date: 01/20/1975   Smokeless tobacco: Former    Types: Chew   Tobacco comments:    Smokes 5 packs of cigarettes a weeks. 09/02/21 Tay  Vaping Use   Vaping status: Never Used  Substance and Sexual Activity   Alcohol use: Yes    Alcohol/week: 42.0 standard drinks of alcohol    Types: 42 Cans of beer per week   Drug use: No   Sexual activity: Yes     Partners: Female  Other Topics Concern   Not on file  Social History Narrative   Not on file   Social Drivers of Health   Financial Resource Strain: Not on file  Food Insecurity: Not on file  Transportation Needs: Not on file  Physical Activity: Not on file  Stress: Not on file  Social Connections: Not on file    Review of Systems Per HPI  Objective:  BP 122/72   Pulse 66   Temp (!) 97.2 F (36.2 C)   Ht 5' 11 (1.803 m)   Wt 172 lb (78 kg)   SpO2 96%   BMI 23.99 kg/m      11/27/2023    8:36 AM 05/27/2023    9:53 AM 05/27/2023    9:17 AM  BP/Weight  Systolic BP 122 127 162  Diastolic BP 72 78 82  Wt. (Lbs) 172  174  BMI 23.99 kg/m2  24.27 kg/m2    Physical Exam Vitals and nursing note reviewed.  Constitutional:      General: He is not in acute distress.    Appearance: Normal appearance.  HENT:     Head: Normocephalic and atraumatic.  Eyes:     General:        Right eye: No discharge.        Left eye:  No discharge.     Conjunctiva/sclera: Conjunctivae normal.  Cardiovascular:     Rate and Rhythm: Normal rate and regular rhythm.  Pulmonary:     Effort: Pulmonary effort is normal.     Breath sounds: Normal breath sounds. No wheezing, rhonchi or rales.  Neurological:     Mental Status: He is alert.  Psychiatric:        Mood and Affect: Mood normal.        Behavior: Behavior normal.     Lab Results  Component Value Date   WBC 7.0 11/19/2022   HGB 14.1 11/19/2022   HCT 41.6 11/19/2022   PLT 332 11/19/2022   GLUCOSE 95 11/19/2022   CHOL 169 11/19/2022   TRIG 49 11/19/2022   HDL 100 11/19/2022   LDLCALC 59 11/19/2022   ALT 38 11/19/2022   AST 27 11/19/2022   NA 130 (L) 11/19/2022   K 4.2 11/19/2022   CL 91 (L) 11/19/2022   CREATININE 0.67 (L) 11/19/2022   BUN 8 11/19/2022   CO2 25 11/19/2022   TSH 0.769 02/20/2021   HGBA1C 5.1 02/20/2021     Assessment & Plan:  COPD GOLD 2 Assessment & Plan: Stable.    Osteoporosis with current  pathological fracture with routine healing, unspecified osteoporosis type, subsequent encounter Assessment & Plan: Dexa ordered. Continue alendronate .  Orders: -     DG Bone Density  Primary hypertension Assessment & Plan: Stable. Continue current medications.   Orders: -     CMP14+EGFR -     Microalbumin / creatinine urine ratio -     Losartan  Potassium; Take 1 tablet (100 mg total) by mouth daily.  Dispense: 90 tablet; Refill: 3  Screening for lipid disorders -     Lipid panel  Screening PSA (prostate specific antigen) -     PSA  Screening for deficiency anemia -     CBC  Cigarette smoker Assessment & Plan: Once again recommended smoking cessation.  Patient not ready to quit.    Follow-up:  6 months  Trinity Hyland Bluford DO [facility]

## 2023-11-30 NOTE — Assessment & Plan Note (Signed)
 Stable

## 2023-11-30 NOTE — Assessment & Plan Note (Signed)
 Dexa ordered. Continue alendronate .

## 2023-11-30 NOTE — Assessment & Plan Note (Signed)
 Once again recommended smoking cessation.  Patient not ready to quit.

## 2023-11-30 NOTE — Assessment & Plan Note (Signed)
 Stable.  Continue current medications.

## 2023-12-09 DIAGNOSIS — I10 Essential (primary) hypertension: Secondary | ICD-10-CM | POA: Diagnosis not present

## 2023-12-09 DIAGNOSIS — Z125 Encounter for screening for malignant neoplasm of prostate: Secondary | ICD-10-CM | POA: Diagnosis not present

## 2023-12-09 DIAGNOSIS — Z130 Encounter for screening for diseases of the blood and blood-forming organs and certain disorders involving the immune mechanism: Secondary | ICD-10-CM | POA: Diagnosis not present

## 2023-12-09 DIAGNOSIS — Z13220 Encounter for screening for lipoid disorders: Secondary | ICD-10-CM | POA: Diagnosis not present

## 2023-12-11 LAB — CMP14+EGFR
ALT: 37 IU/L (ref 0–44)
AST: 29 IU/L (ref 0–40)
Albumin: 4.6 g/dL (ref 3.9–4.9)
Alkaline Phosphatase: 80 IU/L (ref 47–123)
BUN/Creatinine Ratio: 11 (ref 10–24)
BUN: 8 mg/dL (ref 8–27)
Bilirubin Total: 0.6 mg/dL (ref 0.0–1.2)
CO2: 24 mmol/L (ref 20–29)
Calcium: 10 mg/dL (ref 8.6–10.2)
Chloride: 90 mmol/L — ABNORMAL LOW (ref 96–106)
Creatinine, Ser: 0.72 mg/dL — ABNORMAL LOW (ref 0.76–1.27)
Globulin, Total: 2.7 g/dL (ref 1.5–4.5)
Glucose: 98 mg/dL (ref 70–99)
Potassium: 4.8 mmol/L (ref 3.5–5.2)
Sodium: 128 mmol/L — ABNORMAL LOW (ref 134–144)
Total Protein: 7.3 g/dL (ref 6.0–8.5)
eGFR: 101 mL/min/1.73 (ref >59)

## 2023-12-11 LAB — LIPID PANEL
Chol/HDL Ratio: 1.7 ratio (ref 0.0–5.0)
Cholesterol, Total: 153 mg/dL (ref 100–199)
HDL: 89 mg/dL (ref >39)
LDL Chol Calc (NIH): 53 mg/dL (ref 0–99)
Triglycerides: 53 mg/dL (ref 0–149)
VLDL Cholesterol Cal: 11 mg/dL (ref 5–40)

## 2023-12-11 LAB — CBC
Hematocrit: 43.8 % (ref 37.5–51.0)
Hemoglobin: 15.3 g/dL (ref 13.0–17.7)
MCH: 34.9 pg — ABNORMAL HIGH (ref 26.6–33.0)
MCHC: 34.9 g/dL (ref 31.5–35.7)
MCV: 100 fL — ABNORMAL HIGH (ref 79–97)
Platelets: 248 x10E3/uL (ref 150–450)
RBC: 4.39 x10E6/uL (ref 4.14–5.80)
RDW: 12 % (ref 11.6–15.4)
WBC: 4.7 x10E3/uL (ref 3.4–10.8)

## 2023-12-11 LAB — PSA: Prostate Specific Ag, Serum: 1.7 ng/mL (ref 0.0–4.0)

## 2023-12-11 LAB — MICROALBUMIN / CREATININE URINE RATIO
Creatinine, Urine: 60.2 mg/dL
Microalb/Creat Ratio: <5 mg/g{creat} (ref 0–29)
Microalbumin, Urine: <3 ug/mL

## 2023-12-13 ENCOUNTER — Ambulatory Visit: Payer: Self-pay | Admitting: Family Medicine

## 2023-12-25 ENCOUNTER — Ambulatory Visit: Admitting: Internal Medicine

## 2023-12-25 ENCOUNTER — Encounter: Payer: Self-pay | Admitting: Internal Medicine

## 2023-12-25 VITALS — BP 126/77 | HR 95 | Ht 71.0 in | Wt 171.0 lb

## 2023-12-25 DIAGNOSIS — J449 Chronic obstructive pulmonary disease, unspecified: Secondary | ICD-10-CM | POA: Diagnosis not present

## 2023-12-25 DIAGNOSIS — F17210 Nicotine dependence, cigarettes, uncomplicated: Secondary | ICD-10-CM | POA: Diagnosis not present

## 2023-12-25 MED ORDER — PROMETHAZINE-DM 6.25-15 MG/5ML PO SYRP: 5.0000 mL | ORAL_SOLUTION | Freq: Four times a day (QID) | ORAL | 2 refills | Status: AC | PRN

## 2023-12-25 MED ORDER — ALBUTEROL SULFATE HFA 108 (90 BASE) MCG/ACT IN AERS: INHALATION_SPRAY | RESPIRATORY_TRACT | 11 refills | Status: AC

## 2023-12-25 NOTE — Assessment & Plan Note (Addendum)
 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 2) If the pt is convinced, as many are, that saba helps recover from activity faster then it's easy to tell if this is the case by re-challenging : ie stop, take the inhaler, then p 5 minutes try the exact same activity (intensity of workload) that just caused the symptoms and see if they are substantially diminished or not after saba 3) if there is an activity that reproducibly causes the symptoms, try the saba 15 min before the activity on alternate days   If in fact the saba really does help, then fine to continue to use it prn but advised may need to look closer at the maintenance regimen (stiolto for now)  being used to achieve better control of airways disease with exertion.    >>> for CB symptoms :  mucinex dm daytime/ phenergan  dm prn night time cough   >>> recheck alpha one phenotype as level is low by report and nothing in EPIC records to go by

## 2023-12-25 NOTE — Patient Instructions (Addendum)
 Plan A = Automatic = Always=   Stiolto 2 puffs 1st thing am   Plan B = Backup (to supplement plan A, not to replace it) Use your albuterol  inhaler as a rescue medication to be used if you can't catch your breath by resting or slowing your pace  or doing a relaxed purse lip breathing pattern.  - The less you use it, the better it will work when you need it. - Ok to use the inhaler up to 1-2 puffs  every 4 hours if you must but call for appointment if use goes up over your usual need - Don't leave home without it !!  (think of it like the spare tire or starter fluid for your car)   Also  Ok to try albuterol  15 min before an activity (on alternating days)  that you know would usually make you short of breath and see if it makes any difference and if makes none then don't take albuterol  after activity unless you can't catch your breath as this means it's the resting that helps, not the albuterol .  For cough> phenergan  dm  1 tsp every 4 hours vs Mucinex dm 1200 mg every 12 hours (will not make you sleepy  Add flonase  to astepro  for nasal congestion    Please remember to go to the lab department   for your tests - we will call you with the results when they are available.      Please schedule a follow up visit in 6 months but call sooner if needed

## 2023-12-25 NOTE — Assessment & Plan Note (Addendum)
 Referred to LCS  12/24/2022   Continue LCS yearly until age 68 per present guidelines   Counseled re importance of smoking cessation but did not meet time criteria for separate billing    Each maintenance medication was reviewed in detail including emphasizing most importantly the difference between maintenance and prns and under what circumstances the prns are to be triggered using an action plan format where appropriate.  Total time for H and P, chart review, counseling, reviewing hfa/SMI   device(s) and generating customized AVS unique to this office visit / same day charting = 33 min

## 2023-12-25 NOTE — Progress Notes (Signed)
 "   Leonard Armstrong, male    DOB: 1955-10-06    MRN: [id number]   Brief patient profile:  12  yowm  active smoker/MM   former [facility] self-referred back to pulmonary clinic in [place]  12/24/2022  for GOLD 2 copd    A1AT 09/02/21 >> 91, MM PFT 11/14/21 >> FEV1 2.12 (56%), FEV1% 50, TLC 5.21 (70%), DLCO 48%, +BD   History of Present Illness  12/24/2022  Pulmonary/ 1st office eval/ [facility] / [place] [facility]  maint on stiolto  Chief Complaint  Patient presents with   COPD   Cough  Dyspnea:  limited by back / hc parking, does fine at [employer] avg pace  Cough: better now  Sleep: sleeps in recliner due to back or flat bed with lots of pillows x 45 degrees SABA use: not now  02 ldz:wnwz  LDSCT:rec 12/24/2022  No change in your medications  The key is to stop smoking completely before smoking completely stops you! Please schedule a follow up visit in 12  months but call sooner if needed   LDSCT  07/28/23 1. Lung-RADS 2, benign appearance or behavior. Continue annual screening with low-dose chest CT without contrast in 12 months. 2. Moderate emphysema   12/25/2023  f/u ov/[place] [facility] re: GOLD 2 copd / group B maint on stiolto  Chief Complaint  Patient presents with   Shortness of Breath    Shob, cold air is discomforting to pt - congestion - coughing    Dyspnea:  [employer] pushing cart  / [employer] is a stretch Cough: mucus is clear  Sleeping: 45 degrees due to back s   resp cc  SABA use: rarely use it  02: none Nasal congestion better on astepro / also has flonase  but not using both    No obvious day to day or daytime variability or assoc excess/ purulent sputum or mucus plugs or hemoptysis or cp or chest tightness, subjective wheeze or overt   hb symptoms.    Also denies any obvious fluctuation of symptoms with weather or environmental changes or other aggravating or alleviating factors except as outlined above   No unusual exposure hx or h/o childhood pna/ asthma or  knowledge of premature birth.  Current Allergies, Complete Past Medical History, Past Surgical History, Family History, and Social History were reviewed in [employer] record.  ROS  The following are not active complaints unless bolded Hoarseness, sore throat, dysphagia, dental problems resolved , itching, sneezing,  nasal congestion or discharge of excess mucus or purulent secretions, ear ache,   fever, chills, sweats, unintended wt loss or wt gain, classically pleuritic or exertional cp,  orthopnea pnd or arm/hand swelling  or leg swelling, presyncope, palpitations, abdominal pain, anorexia, nausea, vomiting, diarrhea  or change in bowel habits or change in bladder habits, change in stools or change in urine, dysuria, hematuria,  rash, arthralgias, visual complaints, headache, numbness, weakness or ataxia or problems with walking or coordination,  change in mood or  memory.         Outpatient Medications Prior to Visit  Medication Sig Dispense Refill   acetaminophen  (TYLENOL ) 500 MG tablet Take 1,000 mg by mouth every 6 (six) hours as needed for mild pain.     chlorthalidone  (HYGROTON ) 25 MG tablet TAKE 1/2 TABLET(12.5 MG) BY MOUTH DAILY 45 tablet 3   diltiazem  (CARDIZEM ) 30 MG tablet Take 1 tablet (30 mg total) by mouth every 8 (eight) hours as needed (palpitations). 10 tablet 0  levalbuterol  (XOPENEX  HFA) 45 MCG/ACT inhaler Inhale 1-2 puffs into the lungs every 4 (four) hours as needed for wheezing or shortness of breath. 1 each 0   losartan  (COZAAR ) 100 MG tablet Take 1 tablet (100 mg total) by mouth daily. 90 tablet 3   Tiotropium Bromide-Olodaterol (STIOLTO RESPIMAT ) 2.5-2.5 MCG/ACT AERS INHALE 2 PUFFS INTO THE LUNGS DAILY 4 g 1   alendronate  (FOSAMAX ) 70 MG tablet TAKE 1 TABLET(70 MG) BY MOUTH EVERY 7 DAYS WITH A FULL GLASS OF WATER AND ON AN EMPTY STOMACH (Patient not taking: Reported on 12/25/2023) 12 tablet 4   Azelastine  HCl (ASTEPRO ) 0.15 % SOLN Place 1 spray  into the nose daily in the afternoon. (Patient not taking: Reported on 12/25/2023)     diltiazem  (CARDIZEM  CD) 300 MG 24 hr capsule TAKE 1 CAPSULE BY MOUTH DAILY. (Patient not taking: Reported on 12/25/2023) 90 capsule 3   fluticasone  (FLONASE ) 50 MCG/ACT nasal spray Place 1 spray into both nostrils daily. (Patient not taking: Reported on 12/25/2023) 16 g 2   No facility-administered medications prior to visit.        Past Medical History:  Diagnosis Date   Allergy    Back pain    Cancer (HCC)    Skin   COPD (chronic obstructive pulmonary disease) (HCC)    Hypertension    Loss of smell    Tachycardia       Objective:    Wt Readings from Last 3 Encounters:  12/25/23 171 lb (77.6 kg)  11/27/23 172 lb (78 kg)  05/27/23 174 lb (78.9 kg)     Vital signs reviewed  12/25/2023  - Note at rest 02 sats  92% on RA   General appearance:    amb with cane      HEENT : Oropharynx  clear   Nasal turbinates nl   NECK :  without  apparent JVD/ palpable Nodes/TM    LUNGS: no acc muscle use,  Mild barrel  contour chest wall with bilateral  Distant bs s audible wheeze and  without cough on insp or exp maneuvers  and mild  Hyperresonant  to  percussion bilaterally     CV:  RRR  no s3 or murmur or increase in P2, and no edema   ABD:  soft and nontender   MS:   ext warm without deformities Or obvious joint restrictions  calf tenderness, cyanosis or clubbing     SKIN: warm and dry without lesions    NEURO:  alert, approp, nl sensorium with  no motor or cerebellar deficits apparent.       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 2) If the pt is convinced, as many are, that saba helps recover from activity faster then it's easy to tell if this  is the case by re-challenging : ie stop, take the inhaler, then p 5 minutes try the exact same activity (intensity of workload) that just caused the symptoms and see if they are substantially diminished or not after saba 3) if there is an activity that reproducibly causes the symptoms, try the saba 15 min before the activity on alternate days   If in fact the saba really does help, then fine to continue to use it prn but advised may need to look closer at the maintenance regimen (stiolto for now)  being used to achieve better control of airways disease with exertion.    >>> for CB symptoms :  mucinex dm daytime/ phenergan  dm prn night time cough   >>> recheck alpha one phenotype as level is low by report and nothing in EPIC records to go by      Cigarette smoker Referred to [facility]  12/24/2022   Continue [facility]CS yearly until age 14 per present guidelines   Counseled re importance of smoking cessation but did not meet time criteria for separate billing    Each maintenance medication was reviewed in detail including emphasizing most importantly the difference between maintenance and prns and under what circumstances the prns are to be triggered using an action plan format where appropriate.  Total time for H and P, chart review, counseling, reviewing hfa/SMI   device(s) and generating customized AVS unique to this office visit / same day charting = 33 min          AVS  Patient Instructions  Plan A = Automatic = Always=   Stiolto 2 puffs 1st thing am   Plan B = Backup (to supplement plan A, not to replace it) Use your albuterol   inhaler as a rescue medication to be used if you can't catch your breath by resting or slowing your pace  or doing a relaxed purse lip breathing pattern.  - The less you use it, the better it will work when you need it. - Ok to use the inhaler up to 1-2 puffs  every 4 hours if you must but call for appointment if use goes up over your usual need - Don't leave home without it !!  (think of it like the spare tire or starter fluid for your car)   Also  Ok to try albuterol  15 min before an activity (on alternating days)  that you know would usually make you short of breath and see if it makes any difference and if makes none then don't take albuterol  after activity unless you can't catch your breath as this means it's the resting that helps, not the albuterol .  For cough> phenergan  dm  1 tsp every 4 hours vs Mucinex dm 1200 mg every 12 hours (will not make you sleepy  Add flonase  to astepro  for nasal congestion    Please remember to go to the lab department   for your tests - we will call you with the results when they are available.      Please schedule a follow up visit in 6 months but call sooner if needed           Ozell America, MD 12/25/2023         "

## 2024-01-05 ENCOUNTER — Ambulatory Visit: Payer: Self-pay | Admitting: Internal Medicine

## 2024-01-05 LAB — CBC WITH DIFFERENTIAL/PLATELET
Basophils Absolute: 0.1 x10E3/uL (ref 0.0–0.2)
Basos: 1 %
EOS (ABSOLUTE): 0 x10E3/uL (ref 0.0–0.4)
Eos: 0 %
Hematocrit: 41.7 % (ref 37.5–51.0)
Hemoglobin: 14.2 g/dL (ref 13.0–17.7)
Immature Grans (Abs): 0 x10E3/uL (ref 0.0–0.1)
Immature Granulocytes: 0 %
Lymphocytes Absolute: 1.3 x10E3/uL (ref 0.7–3.1)
Lymphs: 17 %
MCH: 34.1 pg — ABNORMAL HIGH (ref 26.6–33.0)
MCHC: 34.1 g/dL (ref 31.5–35.7)
MCV: 100 fL — ABNORMAL HIGH (ref 79–97)
Monocytes Absolute: 0.8 x10E3/uL (ref 0.1–0.9)
Monocytes: 11 %
Neutrophils Absolute: 5.3 x10E3/uL (ref 1.4–7.0)
Neutrophils: 70 %
Platelets: 290 x10E3/uL (ref 150–450)
RBC: 4.16 x10E6/uL (ref 4.14–5.80)
RDW: 12 % (ref 11.6–15.4)
WBC: 7.4 x10E3/uL (ref 3.4–10.8)

## 2024-01-05 LAB — ALPHA-1-ANTITRYPSIN PHENOTYP: A-1 Antitrypsin: 89 mg/dL — ABNORMAL LOW (ref 101–187)

## 2024-01-05 NOTE — Progress Notes (Signed)
 Called and relayed results - pt confirmed

## 2024-01-06 ENCOUNTER — Other Ambulatory Visit: Payer: Self-pay | Admitting: Internal Medicine

## 2024-01-18 ENCOUNTER — Ambulatory Visit (HOSPITAL_COMMUNITY)
Admission: RE | Admit: 2024-01-18 | Discharge: 2024-01-18 | Disposition: A | Discharge status: Home / Self Care | Source: Ambulatory Visit | Attending: Family Medicine | Admitting: Family Medicine

## 2024-01-18 DIAGNOSIS — M8000XD Age-related osteoporosis with current pathological fracture, unspecified site, subsequent encounter for fracture with routine healing: Secondary | ICD-10-CM | POA: Diagnosis present

## 2024-01-18 DIAGNOSIS — M810 Age-related osteoporosis without current pathological fracture: Secondary | ICD-10-CM | POA: Diagnosis not present

## 2024-01-18 DIAGNOSIS — Z87310 Personal history of (healed) osteoporosis fracture: Secondary | ICD-10-CM | POA: Insufficient documentation

## 2024-01-21 ENCOUNTER — Other Ambulatory Visit: Payer: Self-pay | Admitting: Family Medicine

## 2024-01-23 ENCOUNTER — Other Ambulatory Visit: Payer: Self-pay | Admitting: Cardiology

## 2024-01-23 DIAGNOSIS — I10 Essential (primary) hypertension: Secondary | ICD-10-CM

## 2024-02-03 ENCOUNTER — Telehealth: Payer: Self-pay

## 2024-02-03 ENCOUNTER — Other Ambulatory Visit: Payer: Self-pay

## 2024-02-03 NOTE — Telephone Encounter (Signed)
 Copied from CRM #[id number]. Topic: Clinical - Lab/Test Results >> Feb 03, 2024  8:36 AM Tiffany B wrote: Reason for CRM: Patient inquiring about most recent bone density results taken on 01/18/2024. Patient states PCP was going to compare previous bone density results from 2 years ago, please advise patient today. Chart reflects results have not been read by PCP.

## 2024-02-17 ENCOUNTER — Ambulatory Visit: Admitting: Physician Assistant

## 2024-02-18 ENCOUNTER — Ambulatory Visit

## 2024-05-27 ENCOUNTER — Ambulatory Visit: Admitting: Family Medicine
